# Patient Record
Sex: Female | Born: 2000 | Hispanic: Yes | Smoking: Never smoker
Health system: Southern US, Community
[De-identification: ages and names within clinical notes are randomized; demographics above are authoritative.]

## PROBLEM LIST (undated history)

## (undated) DIAGNOSIS — Z789 Other specified health status: Secondary | ICD-10-CM

## (undated) HISTORY — PX: NO PAST SURGERIES: SHX2092

---
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
## 2019-08-08 ENCOUNTER — Ambulatory Visit: Payer: Medicaid Other | Attending: Internal Medicine

## 2019-08-08 DIAGNOSIS — Z23 Encounter for immunization: Secondary | ICD-10-CM

## 2019-08-08 NOTE — Progress Notes (Signed)
   Covid-19 Vaccination Clinic  Name:  Rebekah Cantrell    MRN: [id number] DOB: 28-Jan-2001  08/08/2019  Rebekah Cantrell was observed post Covid-19 immunization for 15 minutes without incident. She was provided with Vaccine Information Sheet and instruction to access the V-Safe system.   Rebekah Cantrell was instructed to call 911 with any severe reactions post vaccine: Marland Kitchen Difficulty breathing  . Swelling of face and throat  . A fast heartbeat  . A bad rash all over body  . Dizziness and weakness   Immunizations Administered    Name Date Dose VIS Date Route   Pfizer COVID-19 Vaccine 08/08/2019  8:20 AM 0.3 mL 04/20/2018 Intramuscular   Manufacturer: [employer], [employer]   Lot: [id number]   NDC: [address]-6

## 2019-08-29 ENCOUNTER — Ambulatory Visit: Payer: Medicaid Other | Attending: Internal Medicine

## 2019-08-29 DIAGNOSIS — Z23 Encounter for immunization: Secondary | ICD-10-CM

## 2019-08-29 NOTE — Progress Notes (Signed)
   Covid-19 Vaccination Clinic  Name:  Rebekah Cantrell    MRN: [id number] DOB: 2000-02-28  08/29/2019  Rebekah Cantrell was observed post Covid-19 immunization for 15 minutes without incident. She was provided with Vaccine Information Sheet and instruction to access the V-Safe system.   Rebekah Cantrell was instructed to call 911 with any severe reactions post vaccine: Marland Kitchen Difficulty breathing  . Swelling of face and throat  . A fast heartbeat  . A bad rash all over body  . Dizziness and weakness   Immunizations Administered    Name Date Dose VIS Date Route   Pfizer COVID-19 Vaccine 08/29/2019  8:12 AM 0.3 mL 04/20/2018 Intramuscular   Manufacturer: [employer], [employer]   Lot: [id number]   NDC: [address]-6

## 2019-09-27 ENCOUNTER — Ambulatory Visit: Payer: Medicaid Other

## 2020-12-12 ENCOUNTER — Emergency Department: Payer: BC Managed Care – PPO

## 2020-12-12 ENCOUNTER — Emergency Department
Admission: EM | Admit: 2020-12-12 | Discharge: 2020-12-12 | Disposition: A | Discharge status: Home / Self Care | Payer: BC Managed Care – PPO | Attending: Emergency Medicine | Admitting: Emergency Medicine

## 2020-12-12 ENCOUNTER — Other Ambulatory Visit: Payer: Self-pay

## 2020-12-12 DIAGNOSIS — R42 Dizziness and giddiness: Secondary | ICD-10-CM | POA: Diagnosis not present

## 2020-12-12 DIAGNOSIS — N939 Abnormal uterine and vaginal bleeding, unspecified: Secondary | ICD-10-CM | POA: Diagnosis not present

## 2020-12-12 DIAGNOSIS — R102 Pelvic and perineal pain: Secondary | ICD-10-CM

## 2020-12-12 LAB — WET PREP, GENITAL
Sperm: NONE SEEN
Trich, Wet Prep: NONE SEEN
Yeast Wet Prep HPF POC: NONE SEEN

## 2020-12-12 LAB — BASIC METABOLIC PANEL
Anion gap: 5 (ref 5–15)
BUN: 7 mg/dL (ref 6–20)
CO2: 26 mmol/L (ref 22–32)
Calcium: 8.9 mg/dL (ref 8.9–10.3)
Chloride: 105 mmol/L (ref 98–111)
Creatinine, Ser: 0.47 mg/dL (ref 0.44–1.00)
GFR, Estimated: >60 mL/min (ref >60)
Glucose, Bld: 93 mg/dL (ref 70–99)
Potassium: 3.5 mmol/L (ref 3.5–5.1)
Sodium: 136 mmol/L (ref 135–145)

## 2020-12-12 LAB — CBC WITH DIFFERENTIAL/PLATELET
Abs Immature Granulocytes: 0.02 10*3/uL (ref 0.00–0.07)
Basophils Absolute: 0 10*3/uL (ref 0.0–0.1)
Basophils Relative: 0 %
Eosinophils Absolute: 0.1 10*3/uL (ref 0.0–0.5)
Eosinophils Relative: 1 %
HCT: 34.9 % — ABNORMAL LOW (ref 36.0–46.0)
Hemoglobin: 12.1 g/dL (ref 12.0–15.0)
Immature Granulocytes: 0 %
Lymphocytes Relative: 37 %
Lymphs Abs: 2.5 10*3/uL (ref 0.7–4.0)
MCH: 30.9 pg (ref 26.0–34.0)
MCHC: 34.7 g/dL (ref 30.0–36.0)
MCV: 89 fL (ref 80.0–100.0)
Monocytes Absolute: 0.4 10*3/uL (ref 0.1–1.0)
Monocytes Relative: 6 %
Neutro Abs: 3.8 10*3/uL (ref 1.7–7.7)
Neutrophils Relative %: 56 %
Platelets: 229 10*3/uL (ref 150–400)
RBC: 3.92 MIL/uL (ref 3.87–5.11)
RDW: 11.9 % (ref 11.5–15.5)
WBC: 6.9 10*3/uL (ref 4.0–10.5)
nRBC: 0 % (ref 0.0–0.2)

## 2020-12-12 LAB — URINALYSIS, ROUTINE W REFLEX MICROSCOPIC
Bilirubin Urine: NEGATIVE
Glucose, UA: NEGATIVE mg/dL
Ketones, ur: NEGATIVE mg/dL
Leukocytes,Ua: NEGATIVE
Nitrite: NEGATIVE
Protein, ur: NEGATIVE mg/dL
Specific Gravity, Urine: 1.018 (ref 1.005–1.030)
pH: 7 (ref 5.0–8.0)

## 2020-12-12 LAB — CHLAMYDIA/NGC RT PCR (ARMC ONLY)
Chlamydia Tr: NOT DETECTED
N gonorrhoeae: NOT DETECTED

## 2020-12-12 LAB — POC URINE PREG, ED: Preg Test, Ur: NEGATIVE

## 2020-12-12 MED ORDER — NORGESTIMATE-ETH ESTRADIOL 0.25-35 MG-MCG PO TABS: 1.0000 | ORAL_TABLET | Freq: Every day | ORAL | 1 refills | Status: DC

## 2020-12-12 NOTE — ED Provider Notes (Signed)
[facility] Emergency Department Provider Note   ____________________________________________   Event Date/Time   First MD Initiated Contact with Patient 12/12/20 1438     (approximate)  I have reviewed the triage vital signs and the nursing notes.   HISTORY  Chief Complaint Vaginal Bleeding    HPI Rebekah Cantrell is a 20 y.o. female with no significant past medical history who presents to the ED complaining of vaginal bleeding.  Patient reports that 3 nights ago she decided to pull out her IUD because she had been dealing with frequent crampy pelvic pain for multiple months.  She states that the pain initially seemed to improve after she remove the IUD, however the past 2 days she has been dealing with frequent vaginal bleeding.  She states she will have to change a pad once an hour over the past 24 to 48 hours.  She denies any associated discharge, does have ongoing pelvic pain.  She spoke with her OB/GYN's office earlier today, who recommended she come to the ED for further evaluation.  She endorses lightheadedness but denies any chest pain or shortness of breath.  Patient states that IUD came out intact and she does not think any pieces broke off.        Past Medical History:  Diagnosis Date   Medical history non-contributory     Patient Active Problem List   Diagnosis Date Noted   Labor and delivery indication for care or intervention 10/18/2018   Decreased fetal movement affecting management of mother, antepartum 07/05/2018    Past Surgical History:  Procedure Laterality Date   NO PAST SURGERIES      Prior to Admission medications   Medication Sig Start Date End Date Taking? Authorizing Provider  norgestimate-ethinyl estradiol (SPRINTEC 28) 0.25-35 MG-MCG tablet Take 1 tablet by mouth daily. 12/12/20  Yes Chesley Noon, MD  Ascorbic Acid (VITAMIN C) 100 MG tablet Take 100 mg by mouth daily.    [provider]  ferrous sulfate  325 (65 FE) MG tablet Take 1 tablet (325 mg total) by mouth daily with breakfast. Take with Vitamin C 10/19/18 12/18/18  Christeen Douglas, MD  ibuprofen (ADVIL) 800 MG tablet Take 1 tablet (800 mg total) by mouth every 8 (eight) hours as needed for moderate pain or cramping. 10/19/18   Christeen Douglas, MD  Prenatal Vit-Fe Fumarate-FA (PRENATAL MULTIVITAMIN) TABS tablet Take 1 tablet by mouth daily at 12 noon.    [provider]    Allergies Patient has no known allergies.  History reviewed. No pertinent family history.  Social History Social History   Tobacco Use   Smoking status: Never   Smokeless tobacco: Never  Vaping Use   Vaping Use: Never used  Substance Use Topics   Alcohol use: Never   Drug use: Never    Review of Systems  Constitutional: No fever/chills.  Positive for lightheadedness. Eyes: No visual changes. ENT: No sore throat. Cardiovascular: Denies chest pain. Respiratory: Denies shortness of breath. Gastrointestinal: No abdominal pain.  No nausea, no vomiting.  No diarrhea.  No constipation. Genitourinary: Negative for dysuria.  Positive for pelvic pain and vaginal bleeding. Musculoskeletal: Negative for back pain. Skin: Negative for rash. Neurological: Negative for headaches, focal weakness or numbness.  ____________________________________________   PHYSICAL EXAM:  VITAL SIGNS: ED Triage Vitals [12/12/20 1350]  Enc Vitals Group     BP 113/66     Pulse Rate 76     Resp 18     Temp  98.5 F (36.9 C)     Temp Source Oral     SpO2 98 %     Weight 130 lb (59 kg)     Height 5\' 2"  (1.575 m)     Head Circumference      Peak Flow      Pain Score 6     Pain Loc      Pain Edu?      Excl. in GC?     Constitutional: Alert and oriented. Eyes: Conjunctivae are normal. Head: Atraumatic. Nose: No congestion/rhinnorhea. Mouth/Throat: Mucous membranes are moist. Neck: Normal ROM Cardiovascular: Normal rate, regular rhythm. Grossly normal heart  sounds. Respiratory: Normal respiratory effort.  No retractions. Lungs CTAB. Gastrointestinal: Soft and nontender. No distention. Genitourinary: Moderate amount of blood noted, no cervical motion or adnexal tenderness. Musculoskeletal: No lower extremity tenderness nor edema. Neurologic:  Normal speech and language. No gross focal neurologic deficits are appreciated. Skin:  Skin is warm, dry and intact. No rash noted. Psychiatric: Mood and affect are normal. Speech and behavior are normal.  ____________________________________________   LABS (all labs ordered are listed, but only abnormal results are displayed)  Labs Reviewed  WET PREP, GENITAL - Abnormal; Notable for the following components:      Result Value   Clue Cells Wet Prep HPF POC PRESENT (*)    WBC, Wet Prep HPF POC MANY (*)    All other components within normal limits  URINALYSIS, ROUTINE W REFLEX MICROSCOPIC - Abnormal; Notable for the following components:   Color, Urine YELLOW (*)    APPearance CLEAR (*)    Hgb urine dipstick LARGE (*)    Bacteria, UA RARE (*)    All other components within normal limits  CBC WITH DIFFERENTIAL/PLATELET - Abnormal; Notable for the following components:   HCT 34.9 (*)    All other components within normal limits  CHLAMYDIA/NGC RT PCR ([facility] ONLY)            BASIC METABOLIC PANEL  POC URINE PREG, ED     PROCEDURES  Procedure(s) performed (including Critical Care):  Procedures   ____________________________________________   INITIAL IMPRESSION / ASSESSMENT AND PLAN / ED COURSE      20 year old female presents to the ED with significant vaginal bleeding after removing her own IUD 3 days ago.  She endorses lightheadedness and we will check labs for her hemoglobin level.  Pelvic exam shows moderate blood with no cervical motion adnexal tenderness, wet prep is pending.  We will also check ultrasound and reassess.  Patient without significant ongoing bleeding at this time,  ultrasound is unremarkable and labs are reassuring with stable hemoglobin.  Findings discussed with Dr. 36 of OB/GYN, recommend starting patient on birth control to help with bleeding.  Patient prefers oral pills to injection and we will prescribe Sprintec per OB/GYN.  Patient counseled to follow-up with OB/GYN and to return to the ED for new worsening symptoms, patient agrees with plan.      ____________________________________________   FINAL CLINICAL IMPRESSION(S) / ED DIAGNOSES  Final diagnoses:  Pelvic pain  Vaginal bleeding     ED Discharge Orders          Ordered    norgestimate-ethinyl estradiol (SPRINTEC 28) 0.25-35 MG-MCG tablet  Daily        12/12/20 1811             Note:  This document was prepared using Dragon voice recognition software and may include unintentional dictation errors.  Chesley Noon, MD 12/12/20 [phone]

## 2020-12-12 NOTE — ED Triage Notes (Signed)
Pt comes pov with IUD issues. Vaginal bleeding. Pt pulled out her own IUD two days ago. Lower pelvic cramping as well. Has appointment to get another but is concerned about the bleeding.

## 2021-02-24 NOTE — L&D Delivery Note (Signed)
Delivery Note Patient progressed quickly to 10 cm.  She developed nausea and vomiting.  With vomiting plus one push, she delivered quickly.  At 4:28 PM a viable female was delivered via Vaginal, Spontaneous (Presentation: Left Occiput Anterior).  APGAR: 9, 9; weight  3250 gm (7 lb 2.6 oz).   Placenta status: Manual removal, Intact.  Cord: 3 vessels with the following complications: None.  Cord pH: n/a  The placenta did not easily deliver at 30 minutes.  A manual removal was required.  The placenta was fundal and posterior and a plane between the placenta and uterus was easily developed. The placenta removed in tact.  Uterine sweep to follow did not reveal any residual placenta or membranes.  She received a single dose of ancef following manual removal of the placenta  Anesthesia: Epidural Episiotomy: None Lacerations: None Suture Repair:  n/a Est. Blood Loss (mL):  125 mL  Mom to postpartum.  Baby to Couplet care / Skin to Skin.  Alrick Cubbage GEFFEL Callen Zuba 09/20/2021, 5:25 PM

## 2021-03-07 LAB — OB RESULTS CONSOLE RUBELLA ANTIBODY, IGM: Rubella: IMMUNE

## 2021-03-07 LAB — OB RESULTS CONSOLE HEPATITIS B SURFACE ANTIGEN: Hepatitis B Surface Ag: NEGATIVE

## 2021-03-07 LAB — OB RESULTS CONSOLE HIV ANTIBODY (ROUTINE TESTING): HIV: NONREACTIVE

## 2021-03-07 LAB — OB RESULTS CONSOLE ABO/RH: RH Type: POSITIVE

## 2021-03-07 LAB — OB RESULTS CONSOLE ANTIBODY SCREEN: Antibody Screen: NEGATIVE

## 2021-03-07 LAB — OB RESULTS CONSOLE RPR: RPR: NONREACTIVE

## 2021-08-23 LAB — OB RESULTS CONSOLE GBS: GBS: NEGATIVE

## 2021-08-23 LAB — OB RESULTS CONSOLE GC/CHLAMYDIA
Chlamydia: NEGATIVE
Neisseria Gonorrhea: NEGATIVE

## 2021-09-17 ENCOUNTER — Telehealth (HOSPITAL_COMMUNITY): Payer: Self-pay | Admitting: *Deleted

## 2021-09-17 ENCOUNTER — Encounter (HOSPITAL_COMMUNITY): Payer: Self-pay | Admitting: *Deleted

## 2021-09-17 NOTE — Telephone Encounter (Signed)
Preadmission screen  

## 2021-09-18 ENCOUNTER — Encounter (HOSPITAL_COMMUNITY): Payer: Self-pay | Admitting: *Deleted

## 2021-09-19 ENCOUNTER — Encounter (HOSPITAL_COMMUNITY): Payer: Self-pay | Admitting: Obstetrics and Gynecology

## 2021-09-19 ENCOUNTER — Inpatient Hospital Stay (EMERGENCY_DEPARTMENT_HOSPITAL)
Admission: AD | Admit: 2021-09-19 | Discharge: 2021-09-19 | Disposition: A | Discharge status: Home / Self Care | Payer: BC Managed Care – PPO | Source: Home / Self Care | Attending: Obstetrics and Gynecology | Admitting: Obstetrics and Gynecology

## 2021-09-19 DIAGNOSIS — O4693 Antepartum hemorrhage, unspecified, third trimester: Secondary | ICD-10-CM | POA: Insufficient documentation

## 2021-09-19 DIAGNOSIS — O26893 Other specified pregnancy related conditions, third trimester: Secondary | ICD-10-CM | POA: Diagnosis not present

## 2021-09-19 DIAGNOSIS — O471 False labor at or after 37 completed weeks of gestation: Secondary | ICD-10-CM | POA: Insufficient documentation

## 2021-09-19 DIAGNOSIS — N898 Other specified noninflammatory disorders of vagina: Secondary | ICD-10-CM | POA: Diagnosis not present

## 2021-09-19 DIAGNOSIS — Z3A40 40 weeks gestation of pregnancy: Secondary | ICD-10-CM

## 2021-09-19 DIAGNOSIS — O480 Post-term pregnancy: Secondary | ICD-10-CM | POA: Insufficient documentation

## 2021-09-19 DIAGNOSIS — Z3689 Encounter for other specified antenatal screening: Secondary | ICD-10-CM

## 2021-09-19 LAB — POCT FERN TEST: POCT Fern Test: NEGATIVE

## 2021-09-19 LAB — AMNISURE RUPTURE OF MEMBRANE (ROM) NOT AT ARMC: Amnisure ROM: NEGATIVE

## 2021-09-19 NOTE — MAU Provider Note (Signed)
History   [id number]   Chief Complaint  Patient presents with   Vaginal Bleeding   Rupture of Membranes   Contractions    HPI Rebekah Cantrell is a 21 y.o. female  G2P1001 @40 .0 wks here with report of gush of fluid around 1300.  Leaking of fluid has continued. Pt reports regular contractions q5 min. She denies vaginal bleeding. Last intercourse was yesterday. She reports good fetal movement. All other systems negative.    Patient's last menstrual period was 10/14/2020 (approximate).  OB History  Gravida Para Term Preterm AB Living  2 1 1     1   SAB IAB Ectopic Multiple Live Births        0 1    # Outcome Date GA Lbr Len/2nd Weight Sex Delivery Anes PTL Lv  2 Current           1 Term 10/18/18 [redacted]w[redacted]d / 02:05 3390 g F Vag-Spont EPI  LIV    Past Medical History:  Diagnosis Date   Medical history non-contributory     History reviewed. No pertinent family history.  Social History   Socioeconomic History   Marital status: Significant Other    Spouse name: 10/20/18   Number of children: Not on file   Years of education: Not on file   Highest education level: Not on file  Occupational History   Not on file  Tobacco Use   Smoking status: Never   Smokeless tobacco: Never  Vaping Use   Vaping Use: Never used  Substance and Sexual Activity   Alcohol use: Never   Drug use: Never   Sexual activity: Yes    Birth control/protection: None  Other Topics Concern   Not on file  Social History Narrative   Not on file   Social Determinants of Health   Financial Resource Strain: Low Risk  (10/18/2018)   Overall Financial Resource Strain (CARDIA)    Difficulty of Paying Living Expenses: Not hard at all  Food Insecurity: No Food Insecurity (10/18/2018)   Hunger Vital Sign    Worried About Running Out of Food in the Last Year: Never true    Ran Out of Food in the Last Year: Never true  Transportation Needs: No Transportation Needs (10/18/2018)   PRAPARE - 10/20/2018 (Medical): No    Lack of Transportation (Non-Medical): No  Physical Activity: Insufficiently Active (10/18/2018)   Exercise Vital Sign    Days of Exercise per Week: 3 days    Minutes of Exercise per Session: 30 min  Stress: No Stress Concern Present (10/18/2018)   10/20/2018 of Occupational Health - Occupational Stress Questionnaire    Feeling of Stress : Only a little  Social Connections: Moderately Isolated (10/18/2018)   Social Connection and Isolation Panel [NHANES]    Frequency of Communication with Friends and Family: More than three times a week    Frequency of Social Gatherings with Friends and Family: More than three times a week    Attends Religious Services: Never    [employer] or Organizations: No    Attends 10/20/2018: Not asked    Marital Status: Living with partner    No Known Allergies  No current facility-administered medications on file prior to encounter.   Current Outpatient Medications on File Prior to Encounter  Medication Sig Dispense Refill   Ascorbic Acid (VITAMIN C) 100 MG tablet Take 100 mg by mouth daily.  ferrous sulfate 325 (65 FE) MG tablet Take 1 tablet (325 mg total) by mouth daily with breakfast. Take with Vitamin C 30 tablet 1   ibuprofen (ADVIL) 800 MG tablet Take 1 tablet (800 mg total) by mouth every 8 (eight) hours as needed for moderate pain or cramping. 30 tablet 1   norgestimate-ethinyl estradiol (SPRINTEC 28) 0.25-35 MG-MCG tablet Take 1 tablet by mouth daily. 28 tablet 1   Prenatal Vit-Fe Fumarate-FA (PRENATAL MULTIVITAMIN) TABS tablet Take 1 tablet by mouth daily at 12 noon.     Review of Systems  Gastrointestinal:  Positive for abdominal pain (ctx).  Genitourinary:  Positive for vaginal discharge. Negative for vaginal bleeding.   Physical Exam   Vitals:   09/19/21 1553  BP: 95/63  Pulse: 85  Resp: 15  Temp: 98.4 F (36.9 C)  TempSrc: Oral  SpO2: 100%  Weight: 70.4  kg  Height: 5\' 3"  (1.6 m)   Physical Exam Vitals and nursing note reviewed. Exam conducted with a chaperone present.  Constitutional:      General: She is not in acute distress.    Appearance: Normal appearance.  HENT:     Head: Normocephalic and atraumatic.  Cardiovascular:     Rate and Rhythm: Normal rate.  Pulmonary:     Effort: Pulmonary effort is normal. No respiratory distress.  Genitourinary:    Comments: SSE: scant pink tinged fluid SVE: 1/30/-2 Musculoskeletal:        General: Normal range of motion.     Cervical back: Normal range of motion.  Neurological:     General: No focal deficit present.     Mental Status: She is alert and oriented to person, place, and time.  Psychiatric:        Mood and Affect: Mood normal.        Behavior: Behavior normal.   EFM: 140 bpm, mod variability, + accels, no decels Toco: 4-5  Results for orders placed or performed during the hospital encounter of 09/19/21 (from the past 24 hour(s))  Amnisure rupture of membrane (rom)not at [facility]     Status: None   Collection Time: 09/19/21  5:26 PM  Result Value Ref Range   Amnisure ROM NEGATIVE   POCT fern test     Status: None   Collection Time: 09/19/21  5:36 PM  Result Value Ref Range   POCT Fern Test Negative = intact amniotic membranes    MAU Course  Procedures  MDM Labs ordered and reviewed. No signs of SROM or labor. Stable for discharge home.  Assessment and Plan   1. [redacted] weeks gestation of pregnancy   2. NST (non-stress test) reactive   3. Vaginal discharge during pregnancy in third trimester    Discharge home Follow [facility] at [facility] as scheduled [facility] Labor precautions  Allergies as of 09/19/2021   No Known Allergies      Medication List     STOP taking these medications    ferrous sulfate 325 (65 FE) MG tablet   ibuprofen 800 MG tablet Commonly known as: ADVIL   norgestimate-ethinyl estradiol 0.25-35 MG-MCG tablet Commonly known as: Sprintec 28    vitamin C 100 MG tablet       TAKE these medications    prenatal multivitamin Tabs tablet Take 1 tablet by mouth daily at 12 noon.        11-10-1999, CNM 09/19/2021 5:50 PM

## 2021-09-19 NOTE — MAU Note (Signed)
...  Rebekah Cantrell is a 21 y.o. at [redacted]w[redacted]d here in MAU reporting: Water broke around noon today while sitting on the toilet. She is unsure of the color of the fluids but reports she saw blood in the toilet. She reports she does not think she was urinating. Has not seen the blood since then. Is not wearing a pad and has not felt any leaking of fluids since then. She reports she is feeling "random" contractions and states they are every 15-20 minutes since noon as well. +DM. Denies LOF.  130-2 on 7/19.  Onset of complaint: Today at noon Pain score: 7/10 lower abdomen  FHT: 152 external Lab orders placed from triage:  MAU Labor Eval

## 2021-09-20 ENCOUNTER — Encounter (HOSPITAL_COMMUNITY): Payer: Self-pay | Admitting: Obstetrics

## 2021-09-20 ENCOUNTER — Inpatient Hospital Stay (HOSPITAL_COMMUNITY)
Admission: AD | Admit: 2021-09-20 | Discharge: 2021-09-21 | DRG: 807 | Disposition: A | Discharge status: Home / Self Care | Payer: BC Managed Care – PPO | Attending: Obstetrics | Admitting: Obstetrics

## 2021-09-20 ENCOUNTER — Inpatient Hospital Stay (HOSPITAL_COMMUNITY): Payer: BC Managed Care – PPO | Admitting: Anesthesiology

## 2021-09-20 ENCOUNTER — Other Ambulatory Visit: Payer: Self-pay

## 2021-09-20 DIAGNOSIS — O480 Post-term pregnancy: Secondary | ICD-10-CM | POA: Diagnosis present

## 2021-09-20 DIAGNOSIS — Z3A40 40 weeks gestation of pregnancy: Secondary | ICD-10-CM | POA: Diagnosis not present

## 2021-09-20 DIAGNOSIS — O4292 Full-term premature rupture of membranes, unspecified as to length of time between rupture and onset of labor: Secondary | ICD-10-CM | POA: Diagnosis present

## 2021-09-20 DIAGNOSIS — O4290 Premature rupture of membranes, unspecified as to length of time between rupture and onset of labor, unspecified weeks of gestation: Principal | ICD-10-CM | POA: Diagnosis present

## 2021-09-20 DIAGNOSIS — O26893 Other specified pregnancy related conditions, third trimester: Secondary | ICD-10-CM | POA: Diagnosis present

## 2021-09-20 LAB — CBC
HCT: 33.3 % — ABNORMAL LOW (ref 36.0–46.0)
Hemoglobin: 10.6 g/dL — ABNORMAL LOW (ref 12.0–15.0)
MCH: 27.1 pg (ref 26.0–34.0)
MCHC: 31.8 g/dL (ref 30.0–36.0)
MCV: 85.2 fL (ref 80.0–100.0)
Platelets: 177 10*3/uL (ref 150–400)
RBC: 3.91 MIL/uL (ref 3.87–5.11)
RDW: 13.4 % (ref 11.5–15.5)
WBC: 9.3 10*3/uL (ref 4.0–10.5)
nRBC: 0 % (ref 0.0–0.2)

## 2021-09-20 LAB — TYPE AND SCREEN
ABO/RH(D): O POS
Antibody Screen: NEGATIVE

## 2021-09-20 LAB — POCT FERN TEST: POCT Fern Test: POSITIVE

## 2021-09-20 MED ORDER — PHENYLEPHRINE 80 MCG/ML (10ML) SYRINGE FOR IV PUSH (FOR BLOOD PRESSURE SUPPORT)
80.0000 ug | PREFILLED_SYRINGE | INTRAVENOUS | Status: DC | PRN
  Filled 2021-09-20: qty 10

## 2021-09-20 MED ORDER — ONDANSETRON HCL 4 MG/2ML IJ SOLN
4.0000 mg | Freq: Four times a day (QID) | INTRAMUSCULAR | Status: DC | PRN
  Administered 2021-09-20: 4 mg via INTRAVENOUS
  Filled 2021-09-20: qty 2

## 2021-09-20 MED ORDER — LACTATED RINGERS IV SOLN: 500.0000 mL | Freq: Once | INTRAVENOUS | Status: DC

## 2021-09-20 MED ORDER — IBUPROFEN 600 MG PO TABS
600.0000 mg | ORAL_TABLET | Freq: Four times a day (QID) | ORAL | Status: DC
Start: 2021-09-20 — End: 2021-09-22
  Administered 2021-09-20 – 2021-09-21 (×4): 600 mg via ORAL
  Filled 2021-09-20 (×3): qty 1

## 2021-09-20 MED ORDER — SIMETHICONE 80 MG PO CHEW: 80.0000 mg | CHEWABLE_TABLET | ORAL | Status: DC | PRN

## 2021-09-20 MED ORDER — BENZOCAINE-MENTHOL 20-0.5 % EX AERO: 1.0000 | INHALATION_SPRAY | CUTANEOUS | Status: DC | PRN

## 2021-09-20 MED ORDER — OXYCODONE HCL 5 MG PO TABS: 5.0000 mg | ORAL_TABLET | ORAL | Status: DC | PRN

## 2021-09-20 MED ORDER — TRANEXAMIC ACID-NACL 1000-0.7 MG/100ML-% IV SOLN
1000.0000 mg | Freq: Once | INTRAVENOUS | Status: AC
  Administered 2021-09-20: 1000 mg via INTRAVENOUS

## 2021-09-20 MED ORDER — EPHEDRINE 5 MG/ML INJ: 10.0000 mg | INTRAVENOUS | Status: DC | PRN

## 2021-09-20 MED ORDER — METHYLERGONOVINE MALEATE 0.2 MG/ML IJ SOLN: 0.2000 mg | INTRAMUSCULAR | Status: DC | PRN

## 2021-09-20 MED ORDER — ONDANSETRON HCL 4 MG/2ML IJ SOLN: 4.0000 mg | INTRAMUSCULAR | Status: DC | PRN

## 2021-09-20 MED ORDER — FENTANYL CITRATE (PF) 100 MCG/2ML IJ SOLN
50.0000 ug | INTRAMUSCULAR | Status: DC | PRN
  Administered 2021-09-20: 100 ug via INTRAVENOUS
  Filled 2021-09-20: qty 2

## 2021-09-20 MED ORDER — DIPHENHYDRAMINE HCL 50 MG/ML IJ SOLN: 12.5000 mg | INTRAMUSCULAR | Status: DC | PRN

## 2021-09-20 MED ORDER — PHENYLEPHRINE 80 MCG/ML (10ML) SYRINGE FOR IV PUSH (FOR BLOOD PRESSURE SUPPORT): 80.0000 ug | PREFILLED_SYRINGE | INTRAVENOUS | Status: DC | PRN

## 2021-09-20 MED ORDER — OXYCODONE HCL 5 MG PO TABS: 10.0000 mg | ORAL_TABLET | ORAL | Status: DC | PRN

## 2021-09-20 MED ORDER — LIDOCAINE-EPINEPHRINE (PF) 2 %-1:200000 IJ SOLN
INTRAMUSCULAR | Status: DC | PRN
  Administered 2021-09-20: 3 mL via EPIDURAL

## 2021-09-20 MED ORDER — LACTATED RINGERS IV SOLN: 500.0000 mL | INTRAVENOUS | Status: DC | PRN

## 2021-09-20 MED ORDER — COCONUT OIL OIL: 1.0000 | TOPICAL_OIL | Status: DC | PRN

## 2021-09-20 MED ORDER — TETANUS-DIPHTH-ACELL PERTUSSIS 5-2.5-18.5 LF-MCG/0.5 IM SUSY: 0.5000 mL | PREFILLED_SYRINGE | Freq: Once | INTRAMUSCULAR | Status: DC

## 2021-09-20 MED ORDER — CEFAZOLIN SODIUM-DEXTROSE 2-4 GM/100ML-% IV SOLN
2.0000 g | Freq: Once | INTRAVENOUS | Status: AC
  Administered 2021-09-20: 2 g via INTRAVENOUS
  Filled 2021-09-20: qty 100

## 2021-09-20 MED ORDER — ACETAMINOPHEN 325 MG PO TABS: 650.0000 mg | ORAL_TABLET | ORAL | Status: DC | PRN

## 2021-09-20 MED ORDER — OXYCODONE-ACETAMINOPHEN 5-325 MG PO TABS: 1.0000 | ORAL_TABLET | ORAL | Status: DC | PRN

## 2021-09-20 MED ORDER — DIBUCAINE (PERIANAL) 1 % EX OINT: 1.0000 | TOPICAL_OINTMENT | CUTANEOUS | Status: DC | PRN

## 2021-09-20 MED ORDER — METHYLERGONOVINE MALEATE 0.2 MG PO TABS
0.2000 mg | ORAL_TABLET | ORAL | Status: DC | PRN
  Administered 2021-09-20: 0.2 mg via ORAL
  Filled 2021-09-20: qty 1

## 2021-09-20 MED ORDER — NITROGLYCERIN 0.4 MG/SPRAY TL SOLN
Status: AC
  Filled 2021-09-20: qty 4.9

## 2021-09-20 MED ORDER — SOD CITRATE-CITRIC ACID 500-334 MG/5ML PO SOLN: 30.0000 mL | ORAL | Status: DC | PRN

## 2021-09-20 MED ORDER — ONDANSETRON HCL 4 MG PO TABS: 4.0000 mg | ORAL_TABLET | ORAL | Status: DC | PRN

## 2021-09-20 MED ORDER — ACETAMINOPHEN 325 MG PO TABS
650.0000 mg | ORAL_TABLET | ORAL | Status: DC | PRN
  Administered 2021-09-21: 650 mg via ORAL
  Filled 2021-09-20: qty 2

## 2021-09-20 MED ORDER — OXYTOCIN-SODIUM CHLORIDE 30-0.9 UT/500ML-% IV SOLN
2.5000 [IU]/h | INTRAVENOUS | Status: DC
  Filled 2021-09-20: qty 500

## 2021-09-20 MED ORDER — FENTANYL-BUPIVACAINE-NACL 0.5-0.125-0.9 MG/250ML-% EP SOLN
12.0000 mL/h | EPIDURAL | Status: DC | PRN
  Administered 2021-09-20: 12 mL/h via EPIDURAL
  Filled 2021-09-20: qty 250

## 2021-09-20 MED ORDER — DIPHENHYDRAMINE HCL 25 MG PO CAPS: 25.0000 mg | ORAL_CAPSULE | Freq: Four times a day (QID) | ORAL | Status: DC | PRN

## 2021-09-20 MED ORDER — TERBUTALINE SULFATE 1 MG/ML IJ SOLN: 0.2500 mg | Freq: Once | INTRAMUSCULAR | Status: DC | PRN

## 2021-09-20 MED ORDER — LIDOCAINE HCL (PF) 1 % IJ SOLN: 30.0000 mL | INTRAMUSCULAR | Status: DC | PRN

## 2021-09-20 MED ORDER — WITCH HAZEL-GLYCERIN EX PADS: 1.0000 | MEDICATED_PAD | CUTANEOUS | Status: DC | PRN

## 2021-09-20 MED ORDER — SENNOSIDES-DOCUSATE SODIUM 8.6-50 MG PO TABS
2.0000 | ORAL_TABLET | ORAL | Status: DC
Start: 2021-09-20 — End: 2021-09-22
  Administered 2021-09-21: 2 via ORAL
  Filled 2021-09-20: qty 2

## 2021-09-20 MED ORDER — TRANEXAMIC ACID-NACL 1000-0.7 MG/100ML-% IV SOLN
INTRAVENOUS | Status: AC
  Filled 2021-09-20: qty 100

## 2021-09-20 MED ORDER — LACTATED RINGERS IV SOLN: INTRAVENOUS | Status: DC

## 2021-09-20 MED ORDER — OXYCODONE-ACETAMINOPHEN 5-325 MG PO TABS: 2.0000 | ORAL_TABLET | ORAL | Status: DC | PRN

## 2021-09-20 MED ORDER — OXYTOCIN BOLUS FROM INFUSION
333.0000 mL | Freq: Once | INTRAVENOUS | Status: AC
  Administered 2021-09-20: 333 mL via INTRAVENOUS

## 2021-09-20 MED ORDER — OXYTOCIN-SODIUM CHLORIDE 30-0.9 UT/500ML-% IV SOLN
1.0000 m[IU]/min | INTRAVENOUS | Status: DC
  Administered 2021-09-20: 2 m[IU]/min via INTRAVENOUS

## 2021-09-20 MED ORDER — BUPIVACAINE HCL (PF) 0.25 % IJ SOLN
INTRAMUSCULAR | Status: DC | PRN
  Administered 2021-09-20 (×2): 3 mL via EPIDURAL

## 2021-09-20 MED ORDER — PRENATAL MULTIVITAMIN CH
1.0000 | ORAL_TABLET | Freq: Every day | ORAL | Status: DC
  Administered 2021-09-21: 1 via ORAL
  Filled 2021-09-20: qty 1

## 2021-09-20 NOTE — Lactation Note (Signed)
This note was copied from a baby's chart. Lactation Consultation Note Attempted to see mom. Mom is heavy heavy bleeding and staff is working on mom at this time. Will see mom again later.  Patient Name: Rebekah Cantrell [id number] Date: 09/20/2021   Age:22 hours  Maternal Data    Feeding    LATCH Score Latch: Repeated attempts needed to sustain latch, nipple held in mouth throughout feeding, stimulation needed to elicit sucking reflex.  Audible Swallowing: A few with stimulation  Type of Nipple: Everted at rest and after stimulation  Comfort (Breast/Nipple): Soft / non-tender  Hold (Positioning): Assistance needed to correctly position infant at breast and maintain latch.  LATCH Score: 7   Lactation Tools Discussed/Used    Interventions    Discharge    Consult Status      Charyl Dancer 09/20/2021, 7:52 PM

## 2021-09-20 NOTE — Progress Notes (Signed)
Now comfortable with epidural FOrebag ruptured with clear fluid SVE 5/100/-2 Toco: was q2-4 minutes, may be spacing out now EFM: category 1  G2P1 at [redacted]w[redacted]d with labor Will monitor closely Add pitocin as needed Anticipate SVD

## 2021-09-20 NOTE — Anesthesia Procedure Notes (Signed)
Epidural Patient location during procedure: OB Start time: 09/20/2021 12:39 PM End time: 09/20/2021 12:53 PM  Staffing Anesthesiologist: Val Eagle, MD Performed: anesthesiologist   Preanesthetic Checklist Completed: patient identified, IV checked, risks and benefits discussed, monitors and equipment checked, pre-op evaluation and timeout performed  Epidural Patient position: sitting Prep: DuraPrep Patient monitoring: heart rate, continuous pulse ox and blood pressure Approach: midline Location: L4-L5 Injection technique: LOR saline  Needle:  Needle type: Tuohy  Needle gauge: 17 G Needle length: 9 cm Needle insertion depth: 6 cm Catheter type: closed end flexible Catheter size: 19 Gauge Catheter at skin depth: 12 cm Test dose: negative and 2% lidocaine with Epi 1:200 K  Assessment Events: blood not aspirated, injection not painful, no injection resistance, paresthesia and negative IV test  Additional Notes pARASTHESI ON RIGHT WITH BOLUS. cATHETER WITHDRAWN. SENSATION RESOLVED WITH INFUSION.

## 2021-09-20 NOTE — Anesthesia Preprocedure Evaluation (Signed)
Anesthesia Evaluation  Patient identified by MRN, date of birth, ID band Patient awake    Reviewed: Allergy & Precautions, Patient's Chart, lab work & pertinent test results  History of Anesthesia Complications Negative for: history of anesthetic complications  Airway Mallampati: I  TM Distance: >3 FB Neck ROM: Full    Dental  (+) Teeth Intact, Dental Advisory Given   Pulmonary neg pulmonary ROS,    breath sounds clear to auscultation       Cardiovascular negative cardio ROS   Rhythm:Regular     Neuro/Psych negative neurological ROS  negative psych ROS   GI/Hepatic negative GI ROS, Neg liver ROS,   Endo/Other  negative endocrine ROS  Renal/GU negative Renal ROS     Musculoskeletal   Abdominal   Peds  Hematology negative hematology ROS (+)   Anesthesia Other Findings   Reproductive/Obstetrics (+) Pregnancy                             Anesthesia Physical Anesthesia Plan  ASA: 2  Anesthesia Plan: Epidural   Post-op Pain Management:    Induction:   PONV Risk Score and Plan: 2 and Treatment may vary due to age or medical condition  Airway Management Planned: Natural Airway  Additional Equipment: None  Intra-op Plan:   Post-operative Plan:   Informed Consent: I have reviewed the patients History and Physical, chart, labs and discussed the procedure including the risks, benefits and alternatives for the proposed anesthesia with the patient or authorized representative who has indicated his/her understanding and acceptance.       Plan Discussed with:   Anesthesia Plan Comments:         Anesthesia Quick Evaluation

## 2021-09-20 NOTE — MAU Note (Signed)
.  Rebekah Cantrell is a 22 y.o. at [redacted]w[redacted]d here in MAU reporting: ctx on and off since last night. Worse this morning about q 4-5 min. Reports some vag bleeding no leaking at this time. Good fetal movement  LMP:  Onset of complaint: last night Pain score: 10 There were no vitals filed for this visit.   FHT: Lab orders placed from triage:  labor eval

## 2021-09-20 NOTE — Progress Notes (Signed)
I was called by RN at approximately 1850.  She reported that the patient did well after arrival from L&D, but with the most recent fundal rub she passed a large amount of clot ( ).  She reported a slow trickle of bleeding following fundal rub.  I was in a delivery at the time and unable to immediately proceed to bedside.  Per RN, vital signs were normal and patient felt well.  I instructed her to administer 1gm TXA IV x 1 dose and make sure that the patient had voided.     Upon my arrival to see the patient after I completed my other delivery, she reported feeling well--much better after eating for the first time in 24 hours.  RN reported an additional 300 mL of clot with additional fundal massage.    On my exam, moderate heme on pad.  VSS. Fundus 1 cm below the umbilicus, firm, only a small trickle of blood with fundal massage.   While it is important to keep retained placenta in differential diagnosis given manual extraction of the placenta, fundus is currently firm.  Bleeding is minimal.  I think it is unlikely that there is retained placenta.  Will continue expectant management.  Patient is currently breastfeeding baby.  She is unsure if she has voided since delivery.  After completing feed, she will get up to void to make sure that a distended bladder is not contributing to bleeding

## 2021-09-20 NOTE — H&P (Signed)
21 y.o. G2P1001 @ [redacted]w[redacted]d presents with contractions and LOF.  She was seen yesterday in MAU and ruled out for rupture with negative fern and negative amnisure at 1730.  She reports that contractions started to increase at 0500.  Soaked through a pad at 0600.  Upon arrival to MAU, fern was positive for ROM.  Otherwise has good fetal movement and no bleeding.  Her pregnancy has been uncomplicated other than a marginal insertion of the umbilical cord for which she had a normal growth [place] at [redacted]w[redacted]d with EFW 47%.    Since arrival to L&D, contractions have increased  Past Medical History:  Diagnosis Date   Medical history non-contributory     Past Surgical History:  Procedure Laterality Date   NO PAST SURGERIES      OB History  Gravida Para Term Preterm AB Living  2 1 1     1   SAB IAB Ectopic Multiple Live Births        0 1    # Outcome Date GA Lbr Len/2nd Weight Sex Delivery Anes PTL Lv  2 Current           1 Term 10/18/18 [redacted]w[redacted]d / 02:05 3390 g F Vag-Spont EPI  LIV    Social History   Socioeconomic History   Marital status: Significant Other    Spouse name: [redacted]w[redacted]d   Number of children: Not on file   Years of education: Not on file   Highest education level: Not on file  Occupational History   Not on file  Tobacco Use   Smoking status: Never   Smokeless tobacco: Never  Vaping Use   Vaping Use: Never used  Substance and Sexual Activity   Alcohol use: Never   Drug use: Never   Sexual activity: Yes    Birth control/protection: None  Other Topics Concern   Not on file  Social History Narrative   Not on file   Social Determinants of Health   Financial Resource Strain: Low Risk  (10/18/2018)   Overall Financial Resource Strain (CARDIA)    Difficulty of Paying Living Expenses: Not hard at all  Food Insecurity: No Food Insecurity (10/18/2018)   Hunger Vital Sign    Worried About Running Out of Food in the Last Year: Never true    Ran Out of Food in the Last Year: Never true   Transportation Needs: No Transportation Needs (10/18/2018)   PRAPARE - 10/20/2018 (Medical): No    Lack of Transportation (Non-Medical): No  Physical Activity: Insufficiently Active (10/18/2018)   Exercise Vital Sign    Days of Exercise per Week: 3 days    Minutes of Exercise per Session: 30 min  Stress: No Stress Concern Present (10/18/2018)   10/20/2018 of Occupational Health - Occupational Stress Questionnaire    Feeling of Stress : Only a little  Social Connections: Moderately Isolated (10/18/2018)   Social Connection and Isolation Panel [NHANES]    Frequency of Communication with Friends and Family: More than three times a week    Frequency of Social Gatherings with Friends and Family: More than three times a week    Attends Religious Services: Never    10/20/2018 or Organizations: No    Attends Database administrator: Not asked    Marital Status: Living with partner  Intimate Partner Violence: Not on file   Patient has no known allergies.    Prenatal Transfer Tool  Maternal Diabetes: No Genetic  Screening: Normal Maternal Ultrasounds/Referrals: Normal Fetal Ultrasounds or other Referrals:  None Maternal Substance Abuse:  No Significant Maternal Medications:  None Significant Maternal Lab Results: Group B Strep negative  ABO, Rh: --/--/PENDING (07/28 1142) Antibody: PENDING (07/28 1142) Rubella: Immune (01/12 0000) RPR: Nonreactive (01/12 0000)  HBsAg: Negative (01/12 0000)  HIV: Non-reactive (01/12 0000)  GBS: Negative/-- (06/30 0000)     Vitals:   09/20/21 1219 09/20/21 1245  BP: 105/64 110/63  Pulse: 81 80  Resp: 18 20  Temp: 98.1 F (36.7 C)   SpO2:  100%     General:  NAD Abdomen:  soft, gravid, EFW 6.5-7# Ex:  no edema SVE:  4/100/-2, forebag present FHTs:  130s, moderate variability, category 1 Toco:  q2-3 minutes   A/P   21 y.o. G2P1001 [redacted]w[redacted]d presents with rupture of membranes and latent  labor Epidural just placed, awaiting relief Will rupture forebag when comfortable Anticipate SVD GBS negative  Rebekah Cantrell Rebekah Cantrell Rebekah Cantrell

## 2021-09-21 LAB — CBC
HCT: 24.7 % — ABNORMAL LOW (ref 36.0–46.0)
Hemoglobin: 7.9 g/dL — ABNORMAL LOW (ref 12.0–15.0)
MCH: 26.9 pg (ref 26.0–34.0)
MCHC: 32 g/dL (ref 30.0–36.0)
MCV: 84 fL (ref 80.0–100.0)
Platelets: 143 10*3/uL — ABNORMAL LOW (ref 150–400)
RBC: 2.94 MIL/uL — ABNORMAL LOW (ref 3.87–5.11)
RDW: 13.4 % (ref 11.5–15.5)
WBC: 13.5 10*3/uL — ABNORMAL HIGH (ref 4.0–10.5)
nRBC: 0 % (ref 0.0–0.2)

## 2021-09-21 LAB — RPR: RPR Ser Ql: NONREACTIVE

## 2021-09-21 MED ORDER — FERROUS SULFATE 325 (65 FE) MG PO TABS: 325.0000 mg | ORAL_TABLET | Freq: Every day | ORAL | 3 refills | Status: DC

## 2021-09-21 MED ORDER — IBUPROFEN 600 MG PO TABS: 600.0000 mg | ORAL_TABLET | Freq: Four times a day (QID) | ORAL | 0 refills | Status: DC

## 2021-09-21 MED ORDER — FERROUS SULFATE 325 (65 FE) MG PO TABS: 325.0000 mg | ORAL_TABLET | Freq: Every day | ORAL | Status: DC

## 2021-09-21 NOTE — Lactation Note (Signed)
This note was copied from a baby's chart. Lactation Consultation Note  Patient Name: Rebekah Cantrell Date: 09/21/2021   Age:22 hours  P2, Observed latch.  Reviewed basics.  Birth parent will call for help as needed.  Feed on demand with cues.  Goal 8-12+ times per day after first 24 hrs.  Place baby STS if not cueing.  Lactation brochure given.      [name] 09/21/2021, 2:23 PM

## 2021-09-21 NOTE — Discharge Summary (Signed)
Postpartum Discharge Summary       Patient Name: Rebekah Cantrell DOB: [birth year] MRN: [id number]  Date of admission: 09/20/2021 Delivery date:09/20/2021  Delivering provider: Marlow Baars  Date of discharge: 09/21/2021  Admitting diagnosis: Full-term premature rupture of membranes (PROM) with unknown onset of labor [O42.90] Intrauterine pregnancy: [redacted]w[redacted]d     Secondary diagnosis:  Principal Problem:   Full-term premature rupture of membranes (PROM) with unknown onset of labor  Additional problems: manual extraction of placenta    Discharge diagnosis: Term Pregnancy Delivered                                              Post partum procedures: TXA Augmentation: N/A Complications: None  Hospital course: Onset of Labor With Vaginal Delivery      21 y.o. yo [id number] at [redacted]w[redacted]d was admitted in Active Labor on 09/20/2021. Patient had an uncomplicated labor course as follows:  Membrane Rupture Time/Date: 6:30 AM ,09/20/2021   Delivery Method:Vaginal, Spontaneous  Episiotomy: None  Lacerations:  None  Patient had a postpartum coursesignificant for passing some excess clots post-delivery ( ) but this reolved with emptying bladder and TXA.   She is ambulating, tolerating a regular diet, passing flatus, and urinating well. Patient is discharged home in stable condition on 09/21/21.  Newborn Data: Birth date:09/20/2021  Birth time:4:28 PM  Gender:Female  Living status:Living  Apgars:9 ,9  Weight:3250 g    Physical exam  Vitals:   09/20/21 1945 09/20/21 2020 09/20/21 2310 09/21/21 0500  BP: 97/64 (!) 86/67 (!) 87/68 (!) 94/58  Pulse: 77 91 87 78  Resp:   16 18  Temp:  98.2 F (36.8 C) 98.2 F (36.8 C) 98.1 F (36.7 C)  TempSrc:  Oral Oral Oral  SpO2: 100%   99%  Weight:      Height:       General: alert and cooperative Lochia: appropriate Uterine Fundus: firm  Labs: Lab Results  Component Value Date   WBC 13.5 (H) 09/21/2021   HGB 7.9 (L) 09/21/2021   HCT 24.7 (L)  09/21/2021   MCV 84.0 09/21/2021   PLT 143 (L) 09/21/2021      Latest Ref Rng & Units 12/12/2020    3:09 PM  CMP  Glucose 70 - 99 mg/dL 93   BUN 6 - 20 mg/dL 7   Creatinine 7.09 - 6.28 mg/dL 3.66   Sodium 294 - 765 mmol/L 136   Potassium 3.5 - 5.1 mmol/L 3.5   Chloride 98 - 111 mmol/L 105   CO2 22 - 32 mmol/L 26   Calcium 8.9 - 10.3 mg/dL 8.9    Edinburgh Score:    09/20/2021    6:11 PM  Edinburgh Postnatal Depression Scale Screening Tool  I have been able to laugh and see the funny side of things. 0  I have looked forward with enjoyment to things. 0  I have blamed myself unnecessarily when things went wrong. 0  I have been anxious or worried for no good reason. 0  I have felt scared or panicky for no good reason. 0  Things have been getting on top of me. 0  I have been so unhappy that I have had difficulty sleeping. 0  I have felt sad or miserable. 0  I have been so unhappy that I have been crying. 0  The thought of harming  myself has occurred to me. 0  Edinburgh Postnatal Depression Scale Total 0     After visit meds:  Allergies as of 09/21/2021   No Known Allergies      Medication List     TAKE these medications    ferrous sulfate 325 (65 FE) MG tablet Take 1 tablet (325 mg total) by mouth daily with breakfast. Start taking on: September 22, 2021   ibuprofen 600 MG tablet Commonly known as: ADVIL Take 1 tablet (600 mg total) by mouth every 6 (six) hours.   prenatal multivitamin Tabs tablet Take 1 tablet by mouth daily at 12 noon.         Discharge home in stable condition Infant Feeding: Breast Infant Disposition:home with mother Discharge instruction: per After Visit Summary and Postpartum booklet. Activity: Advance as tolerated. Pelvic rest for 6 weeks.  Diet: routine diet Future Appointments:No future appointments. Follow up Visit:  Follow-up Information     Marlow Baars, MD. Schedule an appointment as soon as possible for a visit in 4 week(s).    Specialty: Obstetrics Why: Routine pp Contact information: [address] [place] [address] [phone]                  Please schedule this patient for a In person postpartum visit in 4 weeks with the following provider: MD.  Delivery mode:  Vaginal, Spontaneous  Anticipated Birth Control:  Unsure   09/21/2021 Oliver Pila, MD

## 2021-09-21 NOTE — Anesthesia Postprocedure Evaluation (Signed)
Anesthesia Post Note  Patient: Rebekah Cantrell  Procedure(s) Performed: AN AD HOC LABOR EPIDURAL     Patient location during evaluation: Mother Baby Anesthesia Type: Epidural Level of consciousness: awake Pain management: satisfactory to patient Vital Signs Assessment: post-procedure vital signs reviewed and stable Respiratory status: spontaneous breathing Cardiovascular status: stable Anesthetic complications: no   No notable events documented.  Last Vitals:  Vitals:   09/20/21 2310 09/21/21 0500  BP: (!) 87/68 (!) 94/58  Pulse: 87 78  Resp: 16 18  Temp: 36.8 C 36.7 C  SpO2:  99%    Last Pain:  Vitals:   09/21/21 1001  TempSrc:   PainSc: 4    Pain Goal:                   [employer]

## 2021-09-21 NOTE — Progress Notes (Signed)
Post Partum Day 1 Subjective: no complaints  Pt reports no heavy bleeding since last PM and ambulating fine with no dizziness.  Baby feeding well.  She desires d/c home this PM if able.  Objective: Blood pressure (!) 94/58, pulse 78, temperature 98.1 F (36.7 C), temperature source Oral, resp. rate 18, height 5\' 2"  (1.575 m), weight 70.3 kg, last menstrual period 10/14/2020, SpO2 99 %, unknown if currently breastfeeding.  Physical Exam:  General: alert and cooperative Lochia: appropriate Uterine Fundus: firm   Recent Labs    09/20/21 1200 09/21/21 0434  HGB 10.6* 7.9*  HCT 33.3* 24.7*    Assessment/Plan: Discharge home if baby able to go   LOS: 1 day   09/23/21, MD 09/21/2021, 10:46 AM

## 2021-09-24 LAB — SURGICAL PATHOLOGY

## 2021-09-26 ENCOUNTER — Inpatient Hospital Stay (HOSPITAL_COMMUNITY)
Admission: AD | Admit: 2021-09-26 | Payer: BC Managed Care – PPO | Source: Home / Self Care | Admitting: Obstetrics and Gynecology

## 2021-09-26 ENCOUNTER — Inpatient Hospital Stay (HOSPITAL_COMMUNITY): Payer: BC Managed Care – PPO

## 2021-09-28 ENCOUNTER — Telehealth (HOSPITAL_COMMUNITY): Payer: Self-pay | Admitting: *Deleted

## 2021-09-28 NOTE — Telephone Encounter (Signed)
Patient reported passing blood clots yesterday after a trip to the store. Reported that clots were smaller than a golf-ball. Denied soaking a pad or more in an hour. Denied fever. RN instructed patient to call her OB if she is passing clots the size of a golf-ball or larger. Patient verbalized understanding. Patient voiced no other questions or concerns regarding her health at this time. EPDS=0. Patient voiced no questions or concerns regarding infant at this time. Patient reports infant sleeps in a bassinet on his back. RN reviewed ABCs of safe sleep. Patient verbalized understanding. Patient requested RN email information on hospital's virtual postpartum classes and support groups. Email sent. Deforest Hoyles, RN, 09/28/21. [phone]

## 2022-10-25 ENCOUNTER — Other Ambulatory Visit: Payer: Self-pay

## 2022-10-25 ENCOUNTER — Encounter: Payer: Self-pay | Admitting: *Deleted

## 2022-10-25 ENCOUNTER — Ambulatory Visit
Admission: EM | Admit: 2022-10-25 | Discharge: 2022-10-25 | Disposition: A | Discharge status: Home / Self Care | Payer: BC Managed Care – PPO | Attending: Family Medicine | Admitting: Family Medicine

## 2022-10-25 DIAGNOSIS — B349 Viral infection, unspecified: Secondary | ICD-10-CM | POA: Diagnosis present

## 2022-10-25 DIAGNOSIS — Z1152 Encounter for screening for COVID-19: Secondary | ICD-10-CM | POA: Insufficient documentation

## 2022-10-25 DIAGNOSIS — J029 Acute pharyngitis, unspecified: Secondary | ICD-10-CM | POA: Diagnosis present

## 2022-10-25 LAB — POCT RAPID STREP A (OFFICE): Rapid Strep A Screen: NEGATIVE

## 2022-10-25 MED ORDER — PROMETHAZINE-DM 6.25-15 MG/5ML PO SYRP: 5.0000 mL | ORAL_SOLUTION | Freq: Four times a day (QID) | ORAL | 0 refills | Status: DC | PRN

## 2022-10-25 NOTE — ED Provider Notes (Signed)
Rebekah Cantrell    CSN: [id number] Arrival date & time: 10/25/22  1356      History   Chief Complaint Chief Complaint  Patient presents with   Cough   Sore Throat    HPI Rebekah Cantrell is a 22 y.o. female.  Patient presents today with cough, sore throat, generalized bodyaches and generally feeling unwell after traveling from [place] x 1 day ago.  She denies any sick contacts in her household.  She has not had a fever, nausea, vomiting. Denies any history of asthma or chronic respiratory conditions. Past Medical History:  Diagnosis Date   Medical history non-contributory     Patient Active Problem List   Diagnosis Date Noted   Full-term premature rupture of membranes (PROM) with unknown onset of labor 09/20/2021   Labor and delivery indication for care or intervention 10/18/2018   Decreased fetal movement affecting management of mother, antepartum 07/05/2018    Past Surgical History:  Procedure Laterality Date   NO PAST SURGERIES      OB History     Gravida  2   Para  2   Term  2   Preterm      AB      Living  2      SAB      IAB      Ectopic      Multiple  0   Live Births  2            Home Medications    Prior to Admission medications   Medication Sig Start Date End Date Taking? Authorizing Provider  promethazine-dextromethorphan (PROMETHAZINE-DM) 6.25-15 MG/5ML syrup Take 5 mLs by mouth 4 (four) times daily as needed. 10/25/22  Yes Bing Neighbors, NP  ferrous sulfate 325 (65 FE) MG tablet Take 1 tablet (325 mg total) by mouth daily with breakfast. 09/22/21   Huel Cote, MD  ibuprofen (ADVIL) 600 MG tablet Take 1 tablet (600 mg total) by mouth every 6 (six) hours. 09/21/21   Huel Cote, MD  Prenatal Vit-Fe Fumarate-FA (PRENATAL MULTIVITAMIN) TABS tablet Take 1 tablet by mouth daily at 12 noon.    [provider]    Family History History reviewed. No pertinent family history.  Social History Social  History   Tobacco Use   Smoking status: Never   Smokeless tobacco: Never  Vaping Use   Vaping status: Never Used  Substance Use Topics   Alcohol use: Never   Drug use: Never     Allergies   Patient has no known allergies.   Review of Systems Review of Systems Pertinent negatives listed in HPI   Physical Exam Triage Vital Signs ED Triage Vitals  Encounter Vitals Group     BP 10/25/22 1501 93/67     Systolic BP Percentile --      Diastolic BP Percentile --      Pulse Rate 10/25/22 1501 83     Resp 10/25/22 1501 18     Temp 10/25/22 1501 98.9 F (37.2 C)     Temp src --      SpO2 10/25/22 1501 99 %     Weight --      Height --      Head Circumference --      Peak Flow --      Pain Score 10/25/22 1457 6     Pain Loc --      Pain Education --      Exclude from Growth Chart --  No data found.  Updated Vital Signs BP 93/67   Pulse 83   Temp 98.9 F (37.2 C)   Resp 18   LMP 10/21/2022   SpO2 99%   Visual Acuity Right Eye Distance:   Left Eye Distance:   Bilateral Distance:    Right Eye Near:   Left Eye Near:    Bilateral Near:     Physical Exam General Appearance:    Alert, cooperative, no distress  HENT:  Normocephalic,  bilateral TM normal without fluid or infection, neck has bilateral anterior cervical nodes enlarged, pharynx erythematous without exudate, and nasal mucosa congested  Eyes:    PERRL, conjunctiva/corneas clear, EOM's intact       Lungs:     Clear to auscultation bilaterally, respirations unlabored  Heart:    Regular rate and rhythm  Neurologic:   Awake, alert, oriented x 3. No apparent focal neurological           defect.         UC Treatments / Results  Labs (all labs ordered are listed, but only abnormal results are displayed) Labs Reviewed  SARS CORONAVIRUS 2 (TAT 6-24 HRS)  POCT RAPID STREP A (OFFICE)    EKG   Radiology No results found.  Procedures Procedures (including critical care time)  Medications Ordered  in UC Medications - No data to display  Initial Impression / Assessment and Plan / UC Course  I have reviewed the triage vital signs and the nursing notes.  Pertinent labs & imaging results that were available during my care of the patient were reviewed by me and considered in my medical decision making (see chart for details).    Viral illness with pharyngitis, rapid strep is negative. COVID-19 test is pending. Symptom management warranted only. Promethazine DM prescribed.  Patient aware that test results will update to MyChart within 24 to 48 hours.  Return precautions given. Final Clinical Impressions(s) / UC Diagnoses   Final diagnoses:  Viral illness  Viral pharyngitis  Encounter for screening laboratory testing for COVID-19 virus     Discharge Instructions      Rapid strep is negative. Your COVID 19 results will be available in 24-48 hours. Negative results are immediately resulted to Mychart. Positive results will receive a follow-up call from our clinic. Take Tylenol or ibuprofen for any fever or achiness.  I have prescribed her methixene DM which will help with your cough and upper respiratory symptoms.  Encourage forcing fluids and rest of symptoms aside.  If COVID test is positive you may return to normal activities as tolerated as CDC no longer requires any type of quarantine.  If you do have a fever however it is recommended that you avoid direct contact with others for 24 hours until fever free.     ED Prescriptions     Medication Sig Dispense Auth. Provider   promethazine-dextromethorphan (PROMETHAZINE-DM) 6.25-15 MG/5ML syrup Take 5 mLs by mouth 4 (four) times daily as needed. 180 mL Bing Neighbors, NP      PDMP not reviewed this encounter.   Bing Neighbors, NP 10/25/22 1540

## 2022-10-25 NOTE — Discharge Instructions (Signed)
Rapid strep is negative. Your COVID 19 results will be available in 24-48 hours. Negative results are immediately resulted to Mychart. Positive results will receive a follow-up call from our clinic. Take Tylenol or ibuprofen for any fever or achiness.  I have prescribed her methixene DM which will help with your cough and upper respiratory symptoms.  Encourage forcing fluids and rest of symptoms aside.  If COVID test is positive you may return to normal activities as tolerated as CDC no longer requires any type of quarantine.  If you do have a fever however it is recommended that you avoid direct contact with others for 24 hours until fever free.

## 2022-10-25 NOTE — ED Triage Notes (Signed)
Pt reports travel from [facility] home in [place] and has a cough withsorethroat.

## 2022-10-26 LAB — SARS CORONAVIRUS 2 (TAT 6-24 HRS): SARS Coronavirus 2: NEGATIVE

## 2023-01-28 IMAGING — US US PELVIS COMPLETE TRANSABD/TRANSVAG W DUPLEX
1 series · 15 of 25 positions shown · non-contrast
Comparison: None.

CLINICAL DATA: Pelvic pain

EXAM:
TRANSABDOMINAL AND TRANSVAGINAL ULTRASOUND OF PELVIS
DOPPLER ULTRASOUND OF OVARIES
TECHNIQUE: Both transabdominal and transvaginal ultrasound examinations of the
pelvis were performed. Transabdominal technique was performed for
global imaging of the pelvis including uterus, ovaries, adnexal
regions, and pelvic cul-de-sac.
It was necessary to proceed with endovaginal exam following the
transabdominal exam to visualize the uterus, endometrium, ovaries
and adnexa. Color and duplex Doppler ultrasound was utilized to
evaluate blood flow to the ovaries.

[Series 1: us transvaginal non-ob · 15 of 136 slices shown]
[im 1/136]
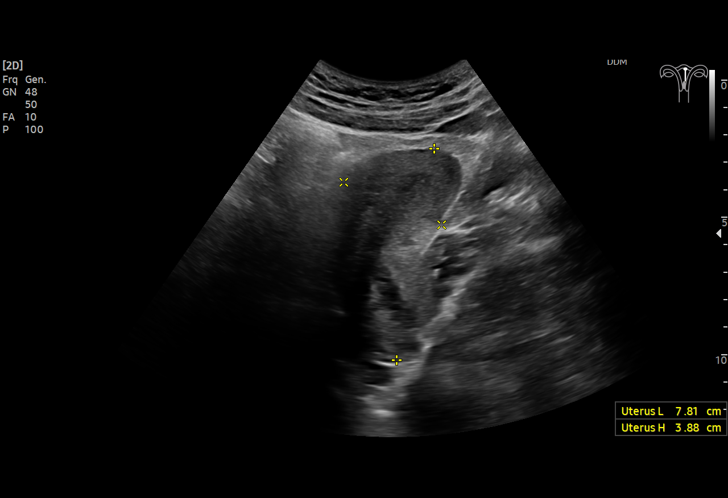
[im 12/136]
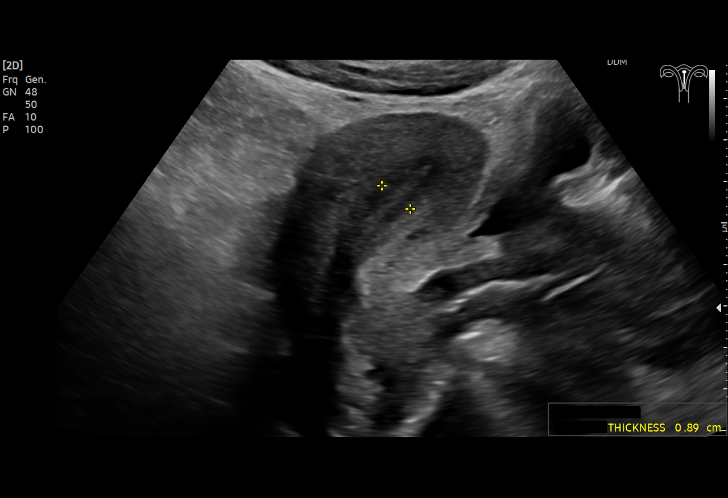
[im 23/136]
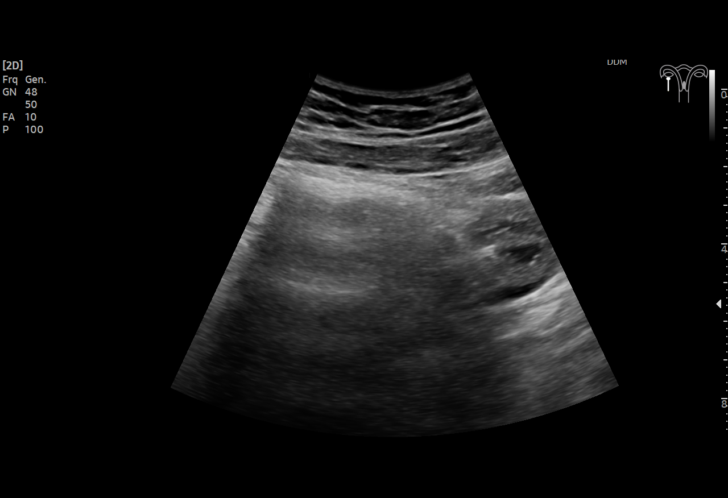
[im 29/136]
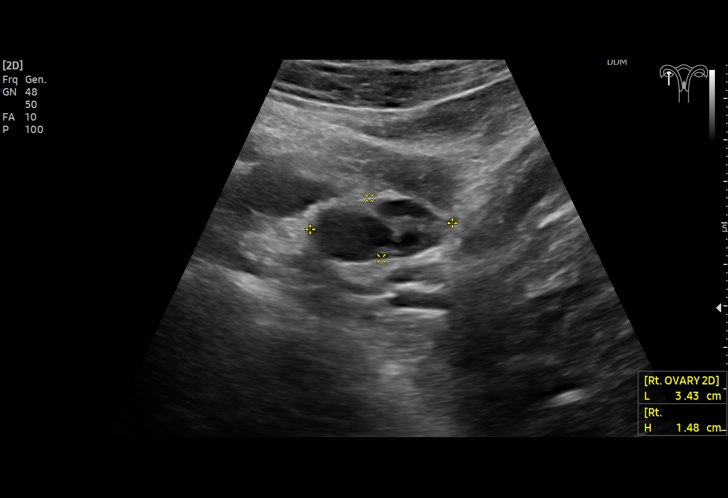
[im 40/136]
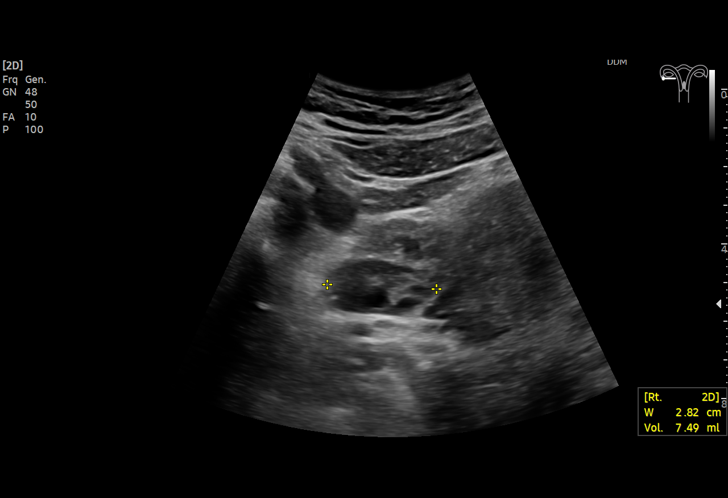
[im 51/136]
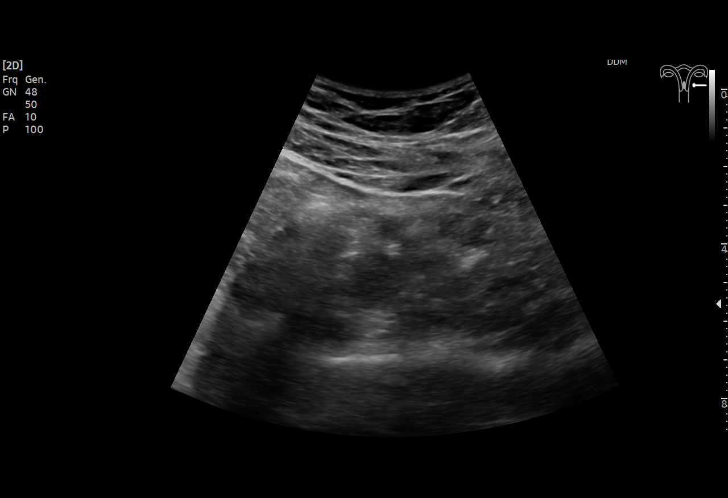
[im 57/136]
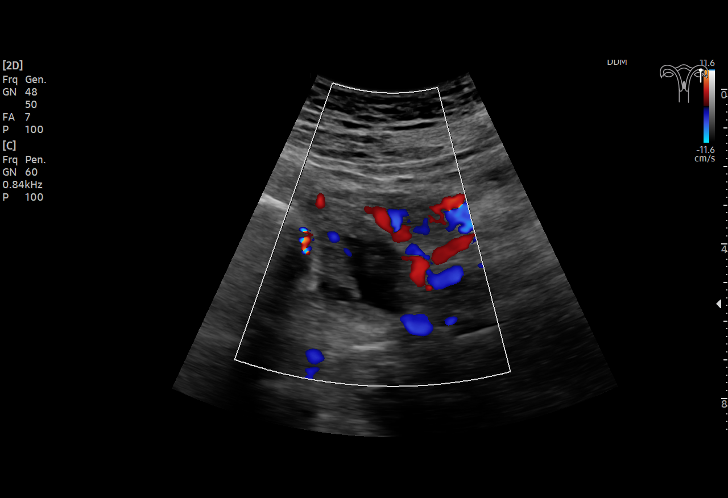
[im 68/136]
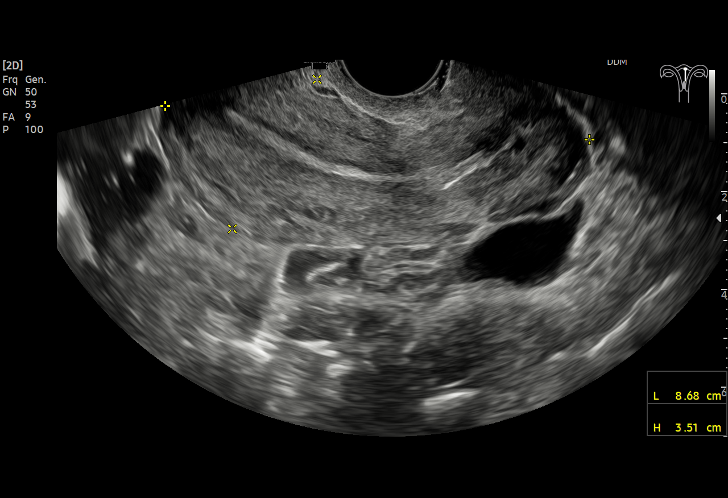
[im 79/136]
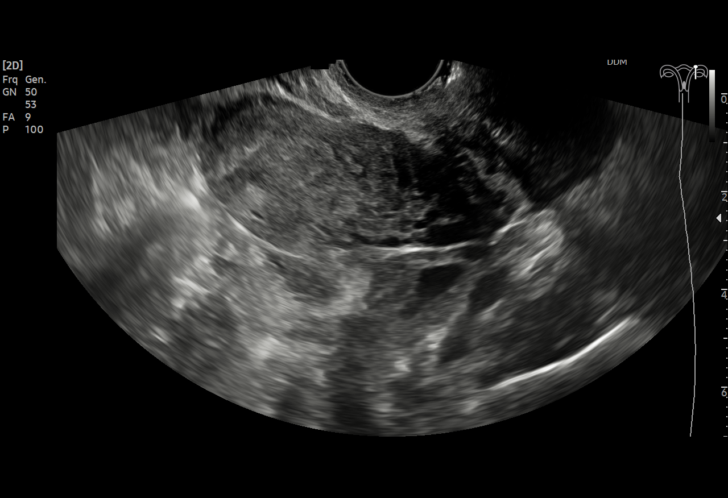
[im 85/136]
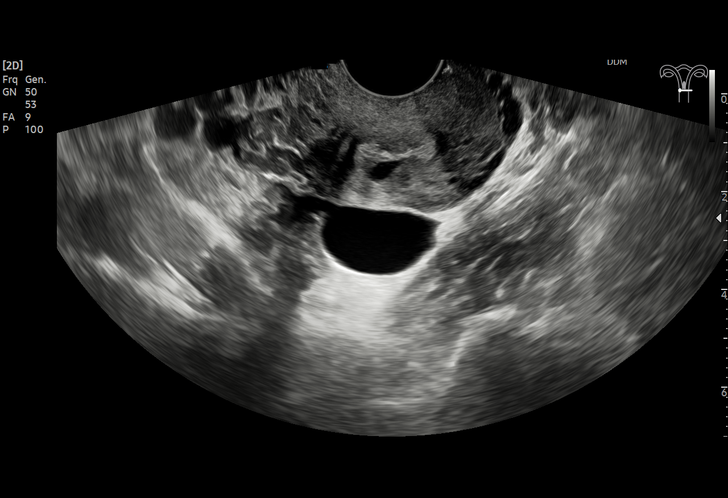
[im 96/136]
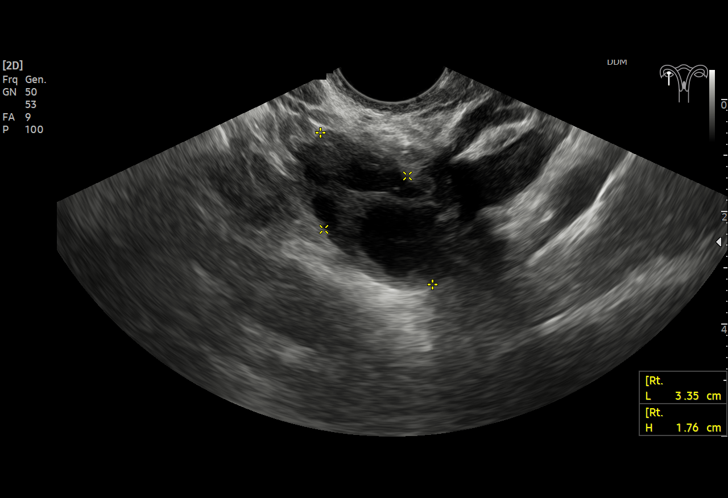
[im 107/136]
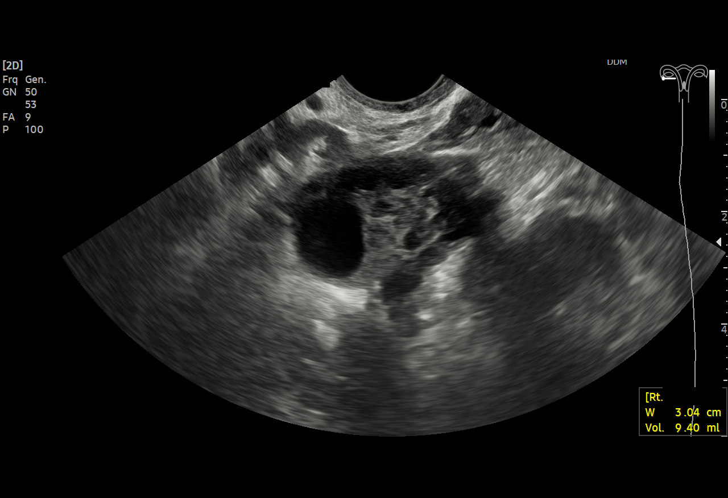
[im 113/136]
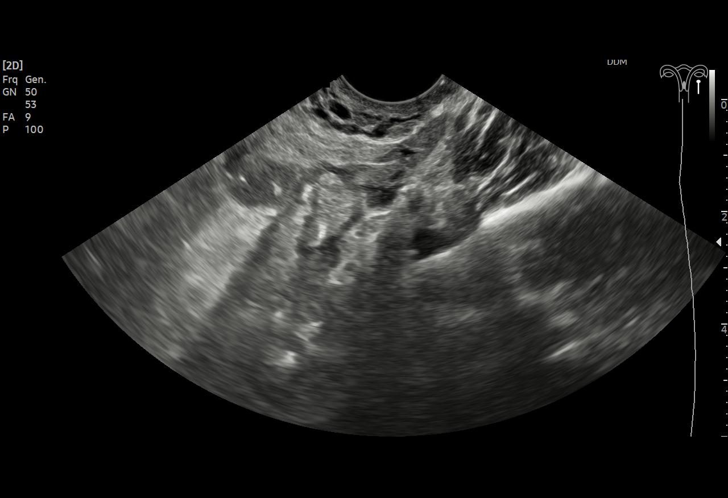
[im 124/136]
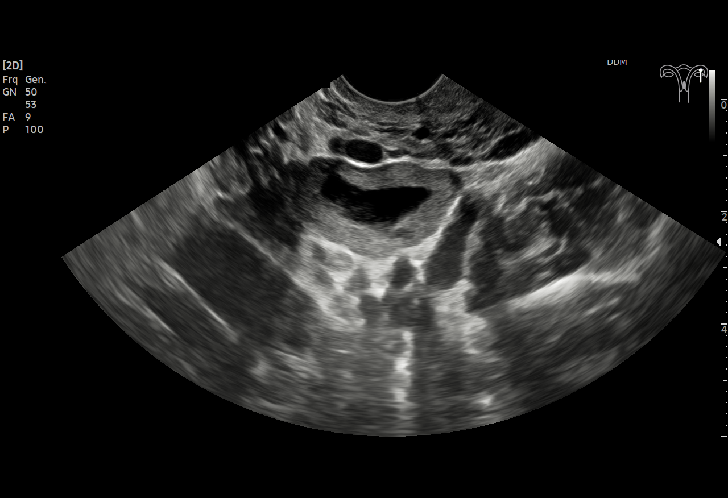
[im 136/136]
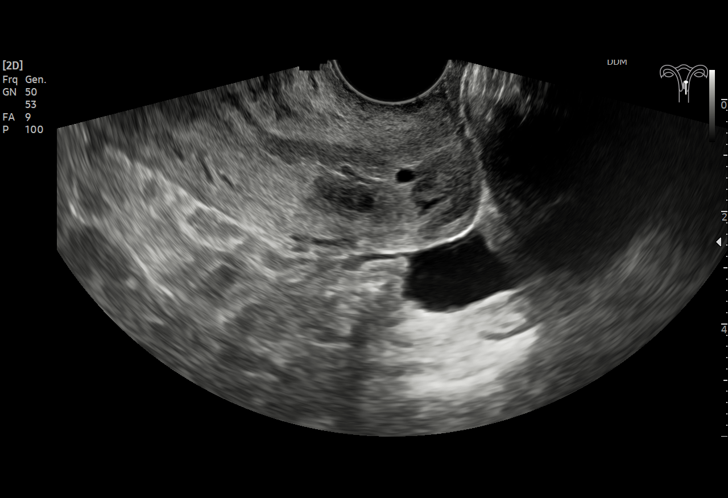

[15 of 25 positions shown; findings below may reference images not displayed]

FINDINGS: Uterus

Measurements: 8.7 x 3.5 x 5.6 cm = volume: 89 mL. No fibroids or
other mass visualized.

Endometrium

Thickness: 6 mm in thickness.  No focal abnormality visualized.

Right ovary

Measurements: 3.4 x 1.8 x 3.0 cm = volume: 9.4 mL. Normal
appearance/no adnexal mass. Small follicles.

Left ovary

Measurements: 3.2 x 1.8 x 3.2 cm = volume: 7.1 mL. Normal
appearance/no adnexal mass. Small follicles.

Pulsed Doppler evaluation of both ovaries demonstrates normal
low-resistance arterial and venous waveforms.

Other findings

Small amount of free fluid in the pelvis.
IMPRESSION: No acute or significant abnormality. No evidence of ovarian torsion.

## 2023-08-24 ENCOUNTER — Encounter: Payer: Self-pay | Admitting: Certified Nurse Midwife

## 2023-08-24 ENCOUNTER — Ambulatory Visit: Admitting: Certified Nurse Midwife

## 2023-08-24 VITALS — BP 100/64 | HR 79 | Resp 16 | Ht 62.0 in | Wt 164.7 lb

## 2023-08-24 DIAGNOSIS — Z30433 Encounter for removal and reinsertion of intrauterine contraceptive device: Secondary | ICD-10-CM

## 2023-08-24 DIAGNOSIS — Z30432 Encounter for removal of intrauterine contraceptive device: Secondary | ICD-10-CM

## 2023-08-24 DIAGNOSIS — Z30430 Encounter for insertion of intrauterine contraceptive device: Secondary | ICD-10-CM

## 2023-08-24 MED ORDER — LEVONORGESTREL 20.1 MCG/DAY IU IUD
1.0000 | INTRAUTERINE_SYSTEM | Freq: Once | INTRAUTERINE | Status: AC
  Administered 2023-08-24: 1 via INTRAUTERINE

## 2023-08-24 NOTE — Progress Notes (Deleted)
     GYNECOLOGY OFFICE PROCEDURE NOTE  Rebekah Cantrell is a 23 y.o. [id number] here for {IUD:23561} IUD removal and reinsertion. No GYN concerns.  Last pap smear was on *** and was normal.  IUD Removal and Reinsertion  Patient identified, informed consent performed, consent signed.   Discussed risks of irregular bleeding, cramping, infection, malpositioning or misplacement of the IUD outside the uterus which may require further procedures. Also discussed >99% contraception efficacy, increased risk of ectopic pregnancy with failure of method.   Emphasized that this did not protect against STIs, condoms recommended during all sexual encounters.Advised to use backup contraception for one week as the risk of pregnancy is higher during the transition period of removing an IUD and replacing it with another one. Time out was performed. Speculum placed in the vagina. The strings of the IUD were grasped and pulled using ring forceps. The IUD was successfully removed in its entirety. The cervix was cleaned with Betadine x 2 and grasped anteriorly with an ***Allis clamp. The uterus was sounded to *** cm;  the {IUD:23561} IUD was then placed per manufacturer's recommendations. Strings trimmed to 3 cm. *** Allis clamp was removed, good hemostasis noted. Patient tolerated procedure well.   Patient was given post-procedure instructions.  She was reminded to have backup contraception for one week during this transition period between IUDs.  Patient was also asked to check IUD strings periodically and follow up as needed with any IUD related concerns.   Damien PARSLEY, CNM

## 2023-08-24 NOTE — Patient Instructions (Signed)
 Levonorgestrel Intrauterine Device (IUD) What is this medication? LEVONORGESTREL (LEE voe nor jes trel) prevents ovulation and pregnancy. It may also be used to treat heavy periods. It belongs to a group of medications called contraceptives. This medication is a progestin hormone. This medicine may be used for other purposes; ask your health care provider or pharmacist if you have questions. COMMON BRAND NAME(S): Cameron Ali What should I tell my care team before I take this medication? They need to know if you have any of these conditions: Abnormal Pap smear Cancer of the breast, uterus, or cervix Diabetes Endometritis Genital or pelvic infection now or in the past Have more than one sexual partner or your partner has more than one partner Heart disease History of an ectopic or tubal pregnancy Immune system problems IUD in place Liver disease or tumor Problems with blood clots or take blood-thinners Seizures Use intravenous drugs Uterus of unusual shape Vaginal bleeding that has not been explained An unusual or allergic reaction to levonorgestrel, other hormones, silicone, or polyethylene, medications, foods, dyes, or preservatives Pregnant or trying to get pregnant Breast-feeding How should I use this medication? This device is placed inside the uterus by your care team. A patient package insert for the product will be given each time it is inserted. Be sure to read this information carefully each time. The sheet may change often. Talk to your care team about use of this medication in children. Special care may be needed. Overdosage: If you think you have taken too much of this medicine contact a poison control center or emergency room at once. NOTE: This medicine is only for you. Do not share this medicine with others. What if I miss a dose? This does not apply. Depending on the brand of device you have inserted, the device will need to be replaced every 3 to 8  years if you wish to continue using this type of birth control. What may interact with this medication? Interactions are not expected. Tell your care team about all the medications you take. This list may not describe all possible interactions. Give your health care provider a list of all the medicines, herbs, non-prescription drugs, or dietary supplements you use. Also tell them if you smoke, drink alcohol, or use illegal drugs. Some items may interact with your medicine. What should I watch for while using this medication? Visit your care team for regular check-ups. Tell your care team if you or your partner becomes HIV positive or gets a sexually transmitted disease. Using this medication does not protect you or your partner against HIV or other sexually transmitted infections (STIs). You can check the placement of the IUD yourself by reaching up to the top of your vagina with clean fingers to feel the threads. Do not pull on the threads. It is a good habit to check placement after each menstrual period. Call your care team right away if you feel more of the IUD than just the threads or if you cannot feel the threads at all. The IUD may come out by itself. You may become pregnant if the device comes out. If you notice that the IUD has come out use a backup birth control method like condoms and call your care team. Using tampons will not change the position of the IUD and are okay to use during your period. This IUD can be safely scanned with magnetic resonance imaging (MRI) only under specific conditions. Before you have an MRI, tell your care team  that you have an IUD in place, and which type of IUD you have in place. What side effects may I notice from receiving this medication? Side effects that you should report to your care team as soon as possible: Allergic reactions--skin rash, itching, hives, swelling of the face, lips, tongue, or throat Blood clot--pain, swelling, or warmth in the leg,  shortness of breath, chest pain Gallbladder problems--severe stomach pain, nausea, vomiting, fever Increase in blood pressure Liver injury--right upper belly pain, loss of appetite, nausea, light-colored stool, dark yellow or brown urine, yellowing skin or eyes, unusual weakness or fatigue New or worsening migraines or headaches Pelvic inflammatory disease (PID)--fever, abdominal pain, pelvic pain, pain or trouble passing urine, spotting, bleeding during or after sex Stroke--sudden numbness or weakness of the face, arm, or leg, trouble speaking, confusion, trouble walking, loss of balance or coordination, dizziness, severe headache, change in vision Unusual vaginal discharge, itching, or odor Vaginal pain, irritation, or sores Worsening mood, feelings of depression Side effects that usually do not require medical attention (report to your care team if they continue or are bothersome): Breast pain or tenderness Dark patches of skin on the face or other sun-exposed areas Irregular menstrual cycles or spotting Nausea Weight gain This list may not describe all possible side effects. Call your doctor for medical advice about side effects. You may report side effects to FDA at 1-800-FDA-1088. Where should I keep my medication? This does not apply. NOTE: This sheet is a summary. It may not cover all possible information. If you have questions about this medicine, talk to your doctor, pharmacist, or health care provider.  2024 Elsevier/Gold Standard (2020-10-24 00:00:00)

## 2023-08-24 NOTE — Progress Notes (Signed)
     GYNECOLOGY OFFICE PROCEDURE NOTE  Charlii Yost is a 23 y.o. [id number] here for Mirena IUD removal and Liletta reinsertion. No GYN concerns.   IUD Removal and Reinsertion  Patient identified, informed consent performed, consent signed.   Discussed risks of irregular bleeding, cramping, infection, malpositioning or misplacement of the IUD outside the uterus which may require further procedures. Also discussed >99% contraception efficacy, increased risk of ectopic pregnancy with failure of method.   Emphasized that this did not protect against STIs, condoms recommended during all sexual encounters.Advised to use backup contraception for one week as the risk of pregnancy is higher during the transition period of removing an IUD and replacing it with another one. Time out was performed. Speculum placed in the vagina. The strings of the IUD were grasped and pulled using an allis. The IUD was successfully removed in its entirety. The cervix was cleaned with Betadine x 2 and grasped anteriorly with an Allis clamp. The uterus was sounded to 9 cm;  the liletta IUD was then placed per manufacturer's recommendations. Strings trimmed to 1 cm, d/t history of trimming needed.  Allis clamp was removed, good hemostasis noted. Patient tolerated procedure well.   Patient was given post-procedure instructions.  She was reminded to have backup contraception for one week during this transition period between IUDs.  Patient was also asked to check IUD strings periodically and follow up as needed with any IUD related concerns.   Rosina Hamilton, Student-MidWife

## 2024-01-15 ENCOUNTER — Ambulatory Visit

## 2024-01-20 ENCOUNTER — Encounter: Payer: Self-pay | Admitting: Emergency Medicine

## 2024-01-20 ENCOUNTER — Ambulatory Visit
Admission: EM | Admit: 2024-01-20 | Discharge: 2024-01-20 | Disposition: A | Discharge status: Home / Self Care | Attending: Emergency Medicine | Admitting: Emergency Medicine

## 2024-01-20 DIAGNOSIS — J020 Streptococcal pharyngitis: Secondary | ICD-10-CM | POA: Diagnosis not present

## 2024-01-20 DIAGNOSIS — J029 Acute pharyngitis, unspecified: Secondary | ICD-10-CM

## 2024-01-20 LAB — POCT RAPID STREP A (OFFICE): Rapid Strep A Screen: POSITIVE — AB

## 2024-01-20 MED ORDER — AMOXICILLIN-POT CLAVULANATE 875-125 MG PO TABS: 1.0000 | ORAL_TABLET | Freq: Two times a day (BID) | ORAL | 0 refills | Status: AC

## 2024-01-20 MED ORDER — ACETAMINOPHEN 325 MG PO TABS
975.0000 mg | ORAL_TABLET | Freq: Once | ORAL | Status: AC
  Administered 2024-01-20: 975 mg via ORAL

## 2024-01-20 NOTE — Discharge Instructions (Signed)
 Your rapid strep test today was positive  Take Augmentin   twice a day for the next 7 days, daily will see improvement in about 48 hours and steady progression from there  may use of salt gargles throat lozenges, warm liquids, teaspoons of honey and over-the-counter clippers septic spray for comfort  May give Tylenol  or Motrin  every 6 hours as needed for additional comfort  You may follow-up at urgent care as needed

## 2024-01-20 NOTE — ED Triage Notes (Signed)
 Patient reports that she has had sore throat, body aches, fever x 1 day. Patient has been taking Nyquil with no relief.  Rates sore throat pain 9/10 and body aches 8/10.

## 2024-01-20 NOTE — ED Provider Notes (Signed)
 [facility]    CSN: [id number] Arrival date & time: 01/20/24  1659      History   Chief Complaint Chief Complaint  Patient presents with   Fever   Sore Throat   Generalized Body Aches    HPI Rebekah Cantrell is a 23 y.o. female.   Patient presents for evaluation of a fever peaking at 100.4, bilateral ear pain, worse to the right side and a sore throat beginning 1 day ago.  Painful to swallow causing poor intake of both food and fluid.  No known sick contacts prior.  Has attempted use of Tylenol  and NyQuil.  Denies congestion or coughing.    Past Medical History:  Diagnosis Date   Medical history non-contributory     Patient Active Problem List   Diagnosis Date Noted   Full-term premature rupture of membranes (PROM) with unknown onset of labor 09/20/2021   Labor and delivery indication for care or intervention 10/18/2018   Decreased fetal movement affecting management of mother, antepartum 07/05/2018    Past Surgical History:  Procedure Laterality Date   NO PAST SURGERIES      OB History     Gravida  2   Para  2   Term  2   Preterm      AB      Living  2      SAB      IAB      Ectopic      Multiple  0   Live Births  2            Home Medications    Prior to Admission medications   Not on File    Family History History reviewed. No pertinent family history.  Social History Social History   Tobacco Use   Smoking status: Never   Smokeless tobacco: Never  Vaping Use   Vaping status: Never Used  Substance Use Topics   Alcohol use: Never   Drug use: Never     Allergies   Patient has no known allergies.   Review of Systems Review of Systems  Constitutional:  Positive for fever. Negative for activity change, appetite change, chills, diaphoresis, fatigue and unexpected weight change.  HENT:  Positive for ear pain and sore throat. Negative for congestion, dental problem, drooling, ear discharge, facial swelling,  hearing loss, mouth sores, nosebleeds, postnasal drip, rhinorrhea, sinus pressure, sinus pain, sneezing, tinnitus, trouble swallowing and voice change.   Respiratory: Negative.    Cardiovascular: Negative.   Gastrointestinal: Negative.      Physical Exam Triage Vital Signs ED Triage Vitals  Encounter Vitals Group     BP 01/20/24 1707 96/60     Girls Systolic BP Percentile --      Girls Diastolic BP Percentile --      Boys Systolic BP Percentile --      Boys Diastolic BP Percentile --      Pulse Rate 01/20/24 1707 (!) 135     Resp 01/20/24 1707 20     Temp 01/20/24 1707 (!) 102.9 F (39.4 C)     Temp Source 01/20/24 1707 Oral     SpO2 01/20/24 1707 98 %     Weight --      Height --      Head Circumference --      Peak Flow --      Pain Score 01/20/24 1712 9     Pain Loc --      Pain Education --  Exclude from Growth Chart --    No data found.  Updated Vital Signs BP 96/60 (BP Location: Left Arm)   Pulse (!) 135   Temp (!) 102.9 F (39.4 C) (Oral)   Resp 20   SpO2 98%   Breastfeeding No   Visual Acuity Right Eye Distance:   Left Eye Distance:   Bilateral Distance:    Right Eye Near:   Left Eye Near:    Bilateral Near:     Physical Exam Constitutional:      Appearance: Normal appearance. She is well-developed.  HENT:     Head: Normocephalic.     Right Ear: Tympanic membrane and ear canal normal.     Left Ear: Tympanic membrane normal.     Nose: No congestion.     Mouth/Throat:     Pharynx: Posterior oropharyngeal erythema present. No oropharyngeal exudate.     Tonsils: Tonsillar exudate present. 3+ on the right. 3+ on the left.  Eyes:     Extraocular Movements: Extraocular movements intact.  Pulmonary:     Effort: Pulmonary effort is normal.  Musculoskeletal:     Cervical back: Normal range of motion.  Lymphadenopathy:     Cervical: Cervical adenopathy present.  Neurological:     Mental Status: She is alert and oriented to person, place, and  time. Mental status is at baseline.      UC Treatments / Results  Labs (all labs ordered are listed, but only abnormal results are displayed) Labs Reviewed  POCT RAPID STREP A (OFFICE)    EKG   Radiology No results found.  Procedures Procedures (including critical care time)  Medications Ordered in UC Medications  acetaminophen  (TYLENOL ) tablet 975 mg (975 mg Oral Given 01/20/24 1722)    Initial Impression / Assessment and Plan / UC Course  I have reviewed the triage vital signs and the nursing notes.  Pertinent labs & imaging results that were available during my care of the patient were reviewed by me and considered in my medical decision making (see chart for details).  Strep pharyngitis, sore throat  Rapid testing positive, discussed with patient, Augmentin  7-day course prescribed, may attempt salt water gargles, throat lozenges, warm to cool liquids per preference, over-the-counter Chloraseptic spray and over-the-counter analgesics for management of discomfort, may follow-up with urgent care as needed if symptoms persist or worsen, note given  Final Clinical Impressions(s) / UC Diagnoses   Final diagnoses:  Sore throat   Discharge Instructions   None    ED Prescriptions   None    PDMP not reviewed this encounter.   Teresa Shelba SAUNDERS, NP 01/20/24 1745

## 2024-03-08 ENCOUNTER — Ambulatory Visit
Admission: RE | Admit: 2024-03-08 | Discharge: 2024-03-08 | Disposition: A | Discharge status: Home / Self Care | Source: Ambulatory Visit | Attending: Emergency Medicine | Admitting: Emergency Medicine

## 2024-03-08 VITALS — BP 100/64 | HR 105 | Temp 99.2°F | Resp 20

## 2024-03-08 DIAGNOSIS — J101 Influenza due to other identified influenza virus with other respiratory manifestations: Secondary | ICD-10-CM | POA: Diagnosis not present

## 2024-03-08 DIAGNOSIS — B349 Viral infection, unspecified: Secondary | ICD-10-CM

## 2024-03-08 LAB — POC COVID19/FLU A&B COMBO
Covid Antigen, POC: NEGATIVE
Influenza A Antigen, POC: POSITIVE — AB
Influenza B Antigen, POC: NEGATIVE

## 2024-03-08 MED ORDER — OSELTAMIVIR PHOSPHATE 75 MG PO CAPS: 75.0000 mg | ORAL_CAPSULE | Freq: Two times a day (BID) | ORAL | 0 refills | Status: AC

## 2024-03-08 NOTE — ED Provider Notes (Signed)
 " [facility]    CSN: [id number] Arrival date & time: 03/08/24  1434      History   Chief Complaint Chief Complaint  Patient presents with   Cough    Cough, fever, chills, and ear pain - Entered by patient   Otalgia   Fever   Chills   Nasal Congestion    HPI Rebekah Cantrell is a 24 y.o. female.   Patient presents for evaluation of a fever peaking at 102, chills, nasal congestion, bilateral ear pain, sore throat and nonproductive cough beginning 2 days ago.  Decreased appetite but tolerable to food and liquids.  No known sick contacts.  Has attempted use of Tylenol .  Denies shortness of breath wheezing or abdominal symptoms.     Past Medical History:  Diagnosis Date   Medical history non-contributory     Patient Active Problem List   Diagnosis Date Noted   Full-term premature rupture of membranes (PROM) with unknown onset of labor 09/20/2021   Labor and delivery indication for care or intervention 10/18/2018   Decreased fetal movement affecting management of mother, antepartum 07/05/2018    Past Surgical History:  Procedure Laterality Date   NO PAST SURGERIES      OB History     Gravida  2   Para  2   Term  2   Preterm      AB      Living  2      SAB      IAB      Ectopic      Multiple  0   Live Births  2            Home Medications    Prior to Admission medications  Not on File    Family History History reviewed. No pertinent family history.  Social History Social History[1]   Allergies   Patient has no known allergies.   Review of Systems Review of Systems  Constitutional:  Positive for chills and fever. Negative for activity change, appetite change, diaphoresis, fatigue and unexpected weight change.  HENT:  Positive for congestion, ear pain and sore throat. Negative for dental problem, drooling, ear discharge, facial swelling, hearing loss, mouth sores, nosebleeds, postnasal drip, rhinorrhea, sinus pressure,  sinus pain, sneezing, tinnitus, trouble swallowing and voice change.   Respiratory:  Positive for cough. Negative for apnea, choking, chest tightness, shortness of breath, wheezing and stridor.   Gastrointestinal: Negative.      Physical Exam Triage Vital Signs ED Triage Vitals  Encounter Vitals Group     BP 03/08/24 1456 100/64     Girls Systolic BP Percentile --      Girls Diastolic BP Percentile --      Boys Systolic BP Percentile --      Boys Diastolic BP Percentile --      Pulse Rate 03/08/24 1456 (!) 105     Resp 03/08/24 1456 20     Temp 03/08/24 1456 99.2 F (37.3 C)     Temp Source 03/08/24 1456 Oral     SpO2 03/08/24 1455 97 %     Weight --      Height --      Head Circumference --      Peak Flow --      Pain Score 03/08/24 1455 6     Pain Loc --      Pain Education --      Exclude from Growth Chart --  No data found.  Updated Vital Signs BP 100/64 (BP Location: Right Arm)   Pulse (!) 105   Temp 99.2 F (37.3 C) (Oral)   Resp 20   SpO2 97%   Visual Acuity Right Eye Distance:   Left Eye Distance:   Bilateral Distance:    Right Eye Near:   Left Eye Near:    Bilateral Near:     Physical Exam Constitutional:      Appearance: Normal appearance.  HENT:     Head: Normocephalic.     Right Ear: Tympanic membrane, ear canal and external ear normal.     Left Ear: Tympanic membrane, ear canal and external ear normal.     Nose: Congestion present.     Mouth/Throat:     Pharynx: Posterior oropharyngeal erythema present. No oropharyngeal exudate.  Eyes:     Extraocular Movements: Extraocular movements intact.  Cardiovascular:     Rate and Rhythm: Normal rate and regular rhythm.     Pulses: Normal pulses.     Heart sounds: Normal heart sounds.  Pulmonary:     Effort: Pulmonary effort is normal.     Breath sounds: Normal breath sounds.  Musculoskeletal:     Cervical back: Normal range of motion and neck supple.  Neurological:     Mental Status: She is  alert and oriented to person, place, and time. Mental status is at baseline.      UC Treatments / Results  Labs (all labs ordered are listed, but only abnormal results are displayed) Labs Reviewed  POC COVID19/FLU A&B COMBO    EKG   Radiology No results found.  Procedures Procedures (including critical care time)  Medications Ordered in UC Medications - No data to display  Initial Impression / Assessment and Plan / UC Course  I have reviewed the triage vital signs and the nursing notes.  Pertinent labs & imaging results that were available during my care of the patient were reviewed by me and considered in my medical decision making (see chart for details).  Influenza A, viral illness  Patient is in no signs of distress nor toxic appearing.  Vital signs are stable.  Low suspicion for pneumonia, pneumothorax or bronchitis and therefore will defer imaging.  Prescribed Tamiflu  and discussed quarantining if with fever. May use additional over-the-counter medications as needed for supportive care.  May follow-up with urgent care as needed if symptoms persist or worsen.    Final Clinical Impressions(s) / UC Diagnoses   Final diagnoses:  Viral illness   Discharge Instructions   None    ED Prescriptions   None    PDMP not reviewed this encounter.     [1]  Social History Tobacco Use   Smoking status: Never   Smokeless tobacco: Never  Vaping Use   Vaping status: Never Used  Substance Use Topics   Alcohol use: Never   Drug use: Never     Teresa Shelba SAUNDERS, NP 03/08/24 1516  "

## 2024-03-08 NOTE — Discharge Instructions (Signed)
 Influenza A is a virus and should steadily improve in time it can take up to 7 to 10 days before you truly start to see a turnaround however things will get better  Begin Tamiflu  every morning and every evening for 5 days to reduce the amount of virus in the body which helps to minimize symptoms  Will need to quarantine until without fever for 24 hours.,  If no fever may continue activity wearing mask for 5 days from the start of your symptoms    You can take Tylenol and/or Ibuprofen as needed for fever reduction and pain relief.   For cough: honey 1/2 to 1 teaspoon (you can dilute the honey in water or another fluid).  You can also use guaifenesin and dextromethorphan for cough. You can use a humidifier for chest congestion and cough.  If you don't have a humidifier, you can sit in the bathroom with the hot shower running.      For sore throat: try warm salt water gargles, cepacol lozenges, throat spray, warm tea or water with lemon/honey, popsicles or ice, or OTC cold relief medicine for throat discomfort.   For congestion: take a daily anti-histamine like Zyrtec, Claritin, and a oral decongestant, such as pseudoephedrine.  You can also use Flonase 1-2 sprays in each nostril daily.   It is important to stay hydrated: drink plenty of fluids (water, gatorade/powerade/pedialyte, juices, or teas) to keep your throat moisturized and help further relieve irritation/discomfort.

## 2024-03-08 NOTE — ED Triage Notes (Signed)
 Patient reports cough, fever, chills, runny nose and bilateral ear pain x 2 days. Patient has taken Tylenol  with mild relief. Rates ear pain 6/10.
